# Patient Record
Sex: Male | Born: 1960
Health system: Southern US, Community
[De-identification: ages and names within clinical notes are randomized; demographics above are authoritative.]

## PROBLEM LIST (undated history)

## (undated) DIAGNOSIS — C61 Malignant neoplasm of prostate: Secondary | ICD-10-CM

## (undated) DIAGNOSIS — M545 Low back pain, unspecified: Secondary | ICD-10-CM

## (undated) DIAGNOSIS — I82409 Acute embolism and thrombosis of unspecified deep veins of unspecified lower extremity: Secondary | ICD-10-CM

## (undated) DIAGNOSIS — M7660 Achilles tendinitis, unspecified leg: Secondary | ICD-10-CM

## (undated) DIAGNOSIS — Z8719 Personal history of other diseases of the digestive system: Secondary | ICD-10-CM

## (undated) DIAGNOSIS — E119 Type 2 diabetes mellitus without complications: Secondary | ICD-10-CM

## (undated) DIAGNOSIS — S93419A Sprain of calcaneofibular ligament of unspecified ankle, initial encounter: Secondary | ICD-10-CM

## (undated) DIAGNOSIS — M1990 Unspecified osteoarthritis, unspecified site: Secondary | ICD-10-CM

## (undated) DIAGNOSIS — I10 Essential (primary) hypertension: Secondary | ICD-10-CM

## (undated) HISTORY — PX: PROSTATE BIOPSY: SHX241

## (undated) HISTORY — DX: Sprain of calcaneofibular ligament of unspecified ankle, initial encounter: S93.419A

## (undated) HISTORY — DX: Type 2 diabetes mellitus without complications: E11.9

## (undated) HISTORY — DX: Low back pain, unspecified: M54.50

## (undated) HISTORY — PX: OTHER SURGICAL HISTORY: SHX169

## (undated) HISTORY — DX: Achilles tendinitis, unspecified leg: M76.60

## (undated) HISTORY — DX: Low back pain: M54.5

---

## 2001-07-15 ENCOUNTER — Emergency Department (HOSPITAL_COMMUNITY): Admission: EM | Admit: 2001-07-15 | Discharge: 2001-07-16 | Payer: Self-pay | Admitting: *Deleted

## 2001-10-06 ENCOUNTER — Encounter: Admission: RE | Admit: 2001-10-06 | Discharge: 2002-01-04 | Payer: Self-pay

## 2001-10-13 ENCOUNTER — Ambulatory Visit (HOSPITAL_COMMUNITY): Admission: RE | Admit: 2001-10-13 | Discharge: 2001-10-13 | Payer: Self-pay | Admitting: Surgery

## 2001-10-16 ENCOUNTER — Encounter: Admission: RE | Admit: 2001-10-16 | Discharge: 2001-10-27 | Payer: Self-pay

## 2005-02-02 ENCOUNTER — Emergency Department (HOSPITAL_COMMUNITY): Admission: EM | Admit: 2005-02-02 | Discharge: 2005-02-02 | Payer: Self-pay | Admitting: Family Medicine

## 2007-02-07 ENCOUNTER — Ambulatory Visit: Payer: Self-pay | Admitting: Physical Medicine & Rehabilitation

## 2007-02-07 ENCOUNTER — Encounter
Admission: RE | Admit: 2007-02-07 | Discharge: 2007-05-08 | Payer: Self-pay | Admitting: Physical Medicine & Rehabilitation

## 2007-02-10 ENCOUNTER — Ambulatory Visit (HOSPITAL_COMMUNITY)
Admission: RE | Admit: 2007-02-10 | Discharge: 2007-02-10 | Payer: Self-pay | Admitting: Physical Medicine & Rehabilitation

## 2007-02-12 ENCOUNTER — Encounter
Admission: RE | Admit: 2007-02-12 | Discharge: 2007-03-11 | Payer: Self-pay | Admitting: Physical Medicine & Rehabilitation

## 2007-03-12 ENCOUNTER — Ambulatory Visit (HOSPITAL_COMMUNITY)
Admission: RE | Admit: 2007-03-12 | Discharge: 2007-03-12 | Payer: Self-pay | Admitting: Physical Medicine & Rehabilitation

## 2007-03-17 ENCOUNTER — Ambulatory Visit: Payer: Self-pay | Admitting: Physical Medicine & Rehabilitation

## 2008-01-12 ENCOUNTER — Ambulatory Visit: Payer: Self-pay | Admitting: Family Medicine

## 2008-01-12 DIAGNOSIS — S93419A Sprain of calcaneofibular ligament of unspecified ankle, initial encounter: Secondary | ICD-10-CM | POA: Insufficient documentation

## 2008-01-12 DIAGNOSIS — M7660 Achilles tendinitis, unspecified leg: Secondary | ICD-10-CM

## 2008-07-14 ENCOUNTER — Ambulatory Visit: Payer: Self-pay | Admitting: Interventional Radiology

## 2008-07-14 ENCOUNTER — Emergency Department (HOSPITAL_BASED_OUTPATIENT_CLINIC_OR_DEPARTMENT_OTHER): Admission: EM | Admit: 2008-07-14 | Discharge: 2008-07-14 | Payer: Self-pay | Admitting: Emergency Medicine

## 2009-05-14 ENCOUNTER — Emergency Department (HOSPITAL_COMMUNITY): Admission: EM | Admit: 2009-05-14 | Discharge: 2009-05-14 | Payer: Self-pay | Admitting: Emergency Medicine

## 2009-06-20 ENCOUNTER — Encounter
Admission: RE | Admit: 2009-06-20 | Discharge: 2009-06-20 | Payer: Self-pay | Admitting: Physical Medicine & Rehabilitation

## 2009-06-20 ENCOUNTER — Ambulatory Visit: Payer: Self-pay | Admitting: Physical Medicine & Rehabilitation

## 2009-12-08 ENCOUNTER — Ambulatory Visit (HOSPITAL_COMMUNITY): Admission: RE | Admit: 2009-12-08 | Discharge: 2009-12-08 | Payer: Self-pay | Admitting: Gastroenterology

## 2010-06-11 ENCOUNTER — Encounter: Payer: Self-pay | Admitting: *Deleted

## 2010-07-19 LAB — DIFFERENTIAL
Basophils Absolute: 0 10*3/uL (ref 0.0–0.1)
Eosinophils Relative: 2 % (ref 0–5)
Lymphs Abs: 1.5 10*3/uL (ref 0.7–4.0)
Monocytes Absolute: 0.6 10*3/uL (ref 0.1–1.0)
Monocytes Relative: 8 % (ref 3–12)
Neutrophils Relative %: 71 % (ref 43–77)

## 2010-07-19 LAB — CBC
HCT: 44.8 % (ref 39.0–52.0)
MCHC: 34.5 g/dL (ref 30.0–36.0)
MCV: 89.8 fL (ref 78.0–100.0)
RBC: 4.98 MIL/uL (ref 4.22–5.81)
WBC: 7.6 10*3/uL (ref 4.0–10.5)

## 2010-07-19 LAB — BASIC METABOLIC PANEL
CO2: 26 mEq/L (ref 19–32)
Chloride: 104 mEq/L (ref 96–112)
Creatinine, Ser: 0.7 mg/dL (ref 0.4–1.5)
GFR calc Af Amer: >60 mL/min (ref >60)
Potassium: 4.5 mEq/L (ref 3.5–5.1)

## 2010-07-19 LAB — D-DIMER, QUANTITATIVE: D-Dimer, Quant: 0.39 ug/mL-FEU (ref 0.00–0.48)

## 2010-07-19 LAB — URIC ACID: Uric Acid, Serum: 8.6 mg/dL — ABNORMAL HIGH (ref 4.0–7.8)

## 2010-08-22 NOTE — Assessment & Plan Note (Signed)
This is a followup appointment for right greater than left-sided low  back pain.   The patient was last seen by me on March 18, 2007 at which time we did  a sacroiliac injection on the right side under fluoro guidance.  He went  on average from a 6/10 pain to now a 2/10 pain.  He has one sore area on  his upper buttock area, this started being noticeable about a week after  an injection.  He can now walk 60 minutes at a time.  He has gone  shopping and has been able to cook standing at the stove for a prolonged  period of time without pain.  He has intermittent numbness in the right  leg, but this has also largely resolved after the injection.   EXAMINATION:  GENERAL:  No acute distress.  Mood and affect are  appropriate.  BACK:  His back has only mild tenderness to palpation over the right  gluteus medius area.  His gait is normal.  His spine range of motion is  75% forward flexion and extension.  LOWER EXTREMITIES STRENGTH:  Normal.   IMPRESSION:  1. 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  I      discussed with the patient the duration of the effect is variable      and he now is to continue with his home exercise program from      physical therapy.  2. Lumbar degenerative disk at L4-5 level does not appear to be the      main pain generator at this time.  Would continue stabilization      program, however.  3. Possible lumbar facet arthropathy, will monitor for signs and      symptoms.  I will see him back on a p.r.n. basis.  Given his      improvement, we can re-inject him as needed.      Erick Colace, M.D.  Electronically Signed     AEK/MedQ  D:  04/01/2007 10:30:44  T:  04/01/2007 11:58:14  Job #:  [id number]   cc:   Claris Gower  Occupational and Physical Therapy

## 2010-08-22 NOTE — Group Therapy Note (Signed)
REASON FOR VISIT:  Low back and right lower extremity pain.   HISTORY:  Patient is a 50 year old male who has a history of low back  pain dating back to when he was in his 69s.  He has been seen by Dr. Zachary George in 2003 for what was low back pain, left anterior side and left  hip pain.  He was hauling a lot of wood rebuilding a basement at that  time and really this started after installing some insulation.  He had  tried some Motrin at that time which helped as well.   He was started on Vioxx but then switched to corticosteroids.  He did  not tolerate the corticosteroids, made him feel funny and only took it  for 2 days.  At that time he had a far lateral disk herniation leftward  L4-5 within the neuroforamen and this was concordant with his pain at  that time.  He did go through some physical therapy and this resulted in  some improvement in his overall symptomatology.   More recently over the last 10 months he has noticed decreasing ability  to ambulate due to pain in the back.  He also has some intermittent pain  down the right lateral side and leg.  Walking on an uphill slope tends  to bring this up, bending forward at the hips and standing bent forward  brings this on.  Extension, rest, heat and stretching backwards seems to  make it better.  He has tried ibuprofen about 800 mg on an as-needed  basis and this does seem to help.  In addition he does have some  problems with pain at night from time to time.   His pain is described as sharp, stabbing, tingling, aching in regards to  the right lower extremity.  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   REVIEW OF SYSTEMS:  Positive for numbness and tingling in the right  lower extremity.  He has had about a 50 pound weight gain attributed to  decreased exercise.   SOCIAL HISTORY:  Married, lives with his wife who is an Production designer, theatre/television/film at  the [employer].  No smoking, 2 drinks per  day.   PAST HISTORY:  Hypertension.   MEDICATIONS:  Metoprolol and fosinopril.   OTHER PAST MEDICAL HISTORY:  Lateral epicondylitis.  He has had one  Urgent Care visit in 2006 for what sounds like gout but no ongoing  problems with this.   High blood pressure 141/86, pulse 79, respirations 18, O2 sat 96% room  air, mood and affect are appropriate, orientation x3, the affect is  bright and alert.  Gait is normal.  His lumbar range of motion is good,  he has full forward flexion and extension.  He has no significant  tenderness to palpation in the lumbar paraspinal area.  He has normal  strength in the lower and upper extremities.  His spine does not show  any signs of scoliosis, although he does give a history that he has been  told he has scoliosis in the past.  His lower extremity reflexes are  difficult to elict, basically 1+ when facilitated in the knees and  ankles, but same is true for the upper extremities.   His range of motion is full in the hips, knees and ankles.  He has no  swelling in the feet, again coloration is good, no bruising, no knee or  ankle effusions.  Neck range of motion is normal.   Review of MRI from 2003:  No problems at L1-2, L2-3 has some diffuse  annular bulging, L3-4 has mild annular bulging, mild facet hypertrophy,  L4-5 has disk herniation posterior lateral on the left and L5-S1 he had  mild facet hypertrophy.   IMPRESSION:  Lumbar degenerative disk appears to have been increasingly  symptomatic over the last 10 months.  In addition he has intermittent  right radicular symptoms that fall in L4 that correspond to L3,4,5  dermatomes.   Of note is that his previous radicular discomfort was on the left side.   He may have some facet arthropathy as well, however primary exacerbating  symptoms are forward flexion.   PLAN:  1. Will check 2 views of the lumbar spine, look for disk space      narrowing, look for spondylosis or even outside chance of  a      vertebral compression.  2. Physical therapy looking at extensor exercises using a therapeutic      ball as well as floor exercises, will also do hamstring stretching      and once some core strengthening is achieved, will transition to      more of an aerobic program which is the patient's ultimate goal -      he would like to get back to hiking.  3. In terms of medications, I think the intermittent Motrin is      appropriate, certainly if he has more constant pain, would give him      a course of nonsteroidal anti-inflammatory on a daily basis.  4. Have given him some samples of Amrix that he can take at night for      muscle spasms and to help with sleep related to this.  5. Have given him literature on lumbar disk herniation, acute and      chronic back pain as well as exercise for back pain.  I will see him back in 3 weeks to monitor his therapy program and check  his x-rays.  Should he not respond to treatment would need repeat MRI  and should his radicular discomfort become more constant, consider  epidural steroid injection, transforaminal route.      Erick Colace, M.D.  Electronically Signed     AEK/MedQ  D:  02/10/2007 09:56:19  T:  02/10/2007 15:27:08  Job #:  [id number]

## 2010-08-22 NOTE — Assessment & Plan Note (Signed)
REASON FOR VISIT:  Low back and right lower extremity pain.   A 50 year old male with a history of lower back pain dating back to when  he was in his 55s.  He was seen by Dr. Leanord Asal in 2003 for low back  pain on left anterior side and left hip pain.  He did have a leftward L4-  5 disk protrusion bilateral herniation into the neural foramen  concordant with his pain.  His pain is now, over the last 10 months,  switched to the right side.  He has had x-rays since last visit  demonstrating what appears to be a right unilateral pars defect at L5.  SI joint and sacrum looks intact.  No evidence for spondylolisthesis.  Of note that an MRI of 2003, there was no evidence of the suspected pars  defect on the right.   He started physical therapy, had about 4 visits, starting some all fours  lumbar stabilization, doing plank exercise.  Pain is exacerbated by  hyperextension in that position but relieved with some flexion of the  spine; however, when he is in the standing position, he seems to have  more pain relief with extension and with forward flexion.  He has some  numbness and tingling right lower extremity which was at the knee last  time I saw him on November 3 but has extended to the proximal lateral  calf on the right side.   I reviewed his x-rays with him today.   PAST HISTORY:  Hypertension, and his blood pressure is a bit up today at  156/92.   His pain interferes with activity at a 9/10 level.  The pain is worse  during the daytime hours, and he does okay at night.  Walking and  bending seemed to exacerbate his pain.  He walks 8 minutes at a time on  a treadmill before he has pain.  He can climb steps; he drives; he  retired about 5 years ago from the [employer].  He has numbness.   REVIEW OF SYSTEMS:  Also positive for weight gain due to what he thinks  contributed to decreased physical activity.   Married, lives with his wife and son.   VITAL SIGNS:  156/92, pulse 78,  respiratory rate 18, O2 saturation 96%  on room air.  GENERAL:  In no acute distress.  Alert and oriented x3.  Affect is  bright and alert.  Gait, he is walking with a limp.  BACK:  No tenderness to palpation.  He has good forward flexion to about  75% range after which this becomes painful.  He does not have any pain  with extension.   His pain is more with right-sided bending than left-sided bending.  Deep  tendon reflexes are normal.  Sensation reduced in right lateral calf.  He has normal strength in hip flexion, knee extension, EHL and  dorsiflexors.  Normal sensation in the feet.   IMPRESSION:  Low back pain and radicular symptoms.  Suspect right L4  and/or L5 radiculopathy.  In terms of his axial pain, may have facet  syndrome, and it is difficult to say how the suspected right-sided pars  fracture might be playing into his symptoms.   PLAN:  Given that he has had some physical therapy and anti-  inflammatories and relative rest but continues with symptomatology and  increasing numbness, we will check MRI of the lumbar spine without  contrast. This should help in further evaluating the suspected pars  fracture but bone scan with spect may be needed additionally for  gauging acuity. I will see him back in 2 weeks.  He will continue  physical therapy avoiding excessive flexion until that time.  May  benefit from transforaminal epidural steroid injection versus facet  injection.      Erick Colace, M.D.  Electronically Signed     AEK/MedQ  D:  02/28/2007 16:52:05  T:  03/01/2007 14:03:15  Job #:  [id number]

## 2010-08-22 NOTE — Procedures (Signed)
[name], [name]                ACCOUNT NO.:  [id number]   MEDICAL RECORD NO.:  [id number]          PATIENT TYPE:  REC   LOCATION:  [facility]                          FACILITY:  [facility]   PHYSICIAN:  Erick Colace, M.D.DATE OF BIRTH:  May 11, 1960   DATE OF PROCEDURE:  DATE OF DISCHARGE:                               OPERATIVE REPORT   PROCEDURE:  Right sacroiliac injection under fluoroscopic guidance.   INDICATIONS:  Right sacroiliac pain going back into the lumbar spine as  well as buttock area, radiating to the posterior thigh.  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   The pain is rated at 6/10 on average and with activity in a seated  position is basically a 0.   Informed consent was obtained after describing the risks and benefits of  the procedure to the patient.  These include bleeding, bruising,  infection, loss of bowel or bladder function, temporary or permanent  paralysis.  He elects to proceed and has given written consent.   The patient was placed prone on fluoroscopy table, Betadine prep,  sterile drape. A 25-gauge inch and a half needle was used to anesthetize  skin and subcu tissue.  Then a 25-gauge 3-inch spinal needle was  inserted under fluoroscopic guidance, right SI joint.  AP, lateral  images utilized.  Omnipaque 180 x0.5 mL demonstrated good joint outline,  no intravascular uptake, followed by injection of 0.5 mL of 40 mg/mL  Depo-Medrol and 1 mL of 2% NPF lidocaine.  The patient tolerated the  procedure well.  Given that his pain is activity-related, provoked by  bending forward and squatting, will ask him to do this at home and  report on relative relief.      Erick Colace, M.D.  Electronically Signed     AEK/MEDQ  D:  03/18/2007 13:07:47  T:  03/18/2007 15:36:56  Job:  [id number]

## 2010-08-25 NOTE — Consult Note (Signed)
[name], [name]                            ACCOUNT NO.:  [id number]   MEDICAL RECORD NO.:  [id number]                   PATIENT TYPE:  OUT   LOCATION:  XRAY                                 FACILITY:  [facility]   PHYSICIAN:  Sondra Come, D.O.                 DATE OF BIRTH:  22-Aug-1960   DATE OF CONSULTATION:  DATE OF DISCHARGE:  10/13/2001                  PHYSICAL MEDICINE & REHABILITATION CONSULTATION   [name] returns to clinic today as scheduled for reevaluation.  He was last  seen on 10/15/2001.  He has finished his course of oral steroids which have  completed resolved his low back pain and lower extremity radicular pain  secondary to an L4-5 disc herniation into the left neural foramen with left  L4 nerve root involvement.  The patient has gone through a physical therapy  program and has been discharged to a home exercise program where he  continues with McKenzie extension exercises as well as abdominal  strengthening.  He attempted to walk yesterday and got about five minutes  into his walk and began to feel a tightness in his low back and stopped.  He  is not currently taking any medications.  Overall he feels like his function  has improved and he is satisfied with his symptom resolution.  He also  complains of occasional discomfort in this mid thoracic region which he has  had over the past 10 years, primarily notices this when he wakes up from  sleep.  It does not bother him during the day.  It seems to ease off with  activity.  He denies any further neurologic complaints.  Denies any bowel or  bladder dysfunction.  I reviewed health and history form and 14-point review  of systems.   PHYSICAL EXAMINATION:  A healthy male in no acute distress.  Blood pressure  133/85, pulse 85, respirations 14, O2 saturation 99% on room air.  Manual  muscle testing is 5/5 bilateral lower extremities.  Sensory examination is  intact to light touch bilateral lower extremities.  Reflexes  are 1+/4  bilateral patellar, 0/4 bilateral medial hamstrings and Achilles today.  Straight leg raise is negative bilaterally.  Examination of his spine  reveals tight, ropey right thoracic paraspinal muscles from T5 to T8 with  tenderness to palpation.  There seems to be a slight decreased rotational  component.   IMPRESSION:  1. Lumbar disc herniation at L4-5 with essentially resolved low back pain     and lower extremity radicular pain.  2. Somatic dysfunction of the thoracic spine.   PLAN:  1. Patient to continue home exercise program and I have instructed him on     scapular stabilization exercises and thoracic stabilization.  He may add     this with his current lumbar stabilization program.  2. Continue Vioxx as needed.  3. If patient's thoracic back pain becomes intolerable will consider further     imaging studies to  include CT scan versus SPECT scan and possibly a     diagnostic facet injection.  Consider further manual therapy although he     has had chiropractic manipulation without any significant improvement.  4. Patient to return to clinic as needed.   The patient was educated in the above findings and recommendations and  understands.  There were no barriers in communication.                                               Sondra Come, D.O.    JJW/MEDQ  D:  10/31/2001  T:  11/07/2001  Job:  [id number]

## 2010-08-25 NOTE — Consult Note (Signed)
[facility]  Patient:    SION, REINDERS Visit Number: [id number] MRN: [id number]          Service Type: PMG Location: [facility] Attending Physician:  Sondra Come Dictated by:   Sondra Come, D.O. Proc. Date: 10/06/01 Admit Date:  10/06/2001                            Consultation Report  CHIEF COMPLAINT:  Low back pain.  HISTORY OF PRESENT ILLNESS:  Mr. Tappan is a pleasant 50 year old right hand dominant male who presents to clinic today complaining of low back pain radiating into his left hip and now into the left anterior thigh over the past four days.  His onset of low back pain was approximately two months ago.  The patient denies any particular injury, although he states at that time he was building a basement and had been hauling a lot of wood.  He began noticing his low back pain when he was installing some insulation.  Initially, he self treated with Motrin and rest which he states helped.  He had asked his family physician once he felt better if he should start increasing his activity and he did so.  Apparently, that night he went on a walk and his symptoms returned.  Since that time he has primarily been resting his low back.  His function and quality of life indexes have declined.  His pain is an 8/10 on a subjective scale.  He describes his pain as constant, dull, achy, and throbbing.  His symptoms are worse with walking and bending and improved with rest, ice, and medications which include Motrin over-the-counter.  He has taken 50 mg of his wifes Vioxx which also seemed to help.  He denies any numbness or paraesthesias in his lower extremities.  He denies bowel and bladder dysfunction.  He denies fever, chills, night sweats, weight loss.  He denies any increased low back pain with coughing, sneezing, or bowel movement. He denies any lower extremity weakness.  I review health and history form and 14 point review of systems.  The patient admits  to occasional heartburn.  PAST MEDICAL HISTORY:  Hypertension.  PAST SURGICAL HISTORY:  Denies.  FAMILY HISTORY:  Cancer, hypertension.  SOCIAL HISTORY:  Denies smoking.  Admits to occasional alcohol use.  He is married and is not currently working.  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  ALLERGIES:  SULFA.  MEDICATIONS:  Over-the-counter Motrin.  PHYSICAL EXAMINATION  GENERAL:  Healthy male in no acute distress.  VITAL SIGNS:  Blood pressure 126/77, pulse 95, respirations 12, O2 saturation 96% on room air.  BACK:  Level pelvis without scoliosis.  There is normal lumbar lordosis. Range of motion of the lumbar spine is full in all planes with pain mainly on left rotation plus extension.  There is minimal left-sided lumbar discomfort on flexion.  NEUROLOGIC:  Manual muscle testing is 5/5 bilateral lower extremities. Sensory examination is intact to light touch bilateral lower extremities. Straight leg raise is negative bilaterally.  Femoral stretch test is negative bilaterally.  Muscle stretch reflexes are 2+/4 bilateral patellar, 1+/4 bilateral medial hamstrings, and 0/4 bilateral Achilles.  Toes are downgoing. No ankle clonus noted.  EXTREMITIES:  No heat, erythema, or edema in the lower extremities.  IMPRESSION:  Low back pain, etiology uncertain.  Physical examination suggests possible posterior element pain such as coming from the lumbar facet joints. However, cannot rule  out lumbar disk herniation, although physical examination is not entirely consistent with this.  PLAN: 1. I had a long discussion with Mr. Borowski regarding treatment options.  At this    point it is reasonable to obtain an MRI of the lumbar spine to rule out    herniated disk and facet arthropathy. 2. Will begin Vioxx 25 mg two p.o. q.d. x5 days, then one p.o. q.d. #35 with    one refill. 3. Will begin physical therapy for range of motion, stretching, lumbar    stabilization exercises, and  modalities as needed leading to a home    exercise program three times per week for four weeks. 4. Consider interventional procedures as predicated upon patients response to    the above treatment as well as indicated by imaging studies. 5. The patient is to return to clinic following his MRI.  The patient was educated on the above findings and recommendations and understands.  No barriers to communication. Dictated by:   Sondra Come, D.O. Attending Physician:  Sondra Come DD:  10/06/01 TD:  10/08/01 Job: [id number]

## 2010-08-25 NOTE — Consult Note (Signed)
[facility]  Patient:    [name], [name] Visit Number: [id number] MRN: [id number]          Service Type: PMG Location: [facility] Attending Physician:  Sondra Come Dictated by:   Sondra Come, D.O. Proc. Date: 10/15/01 Admit Date:  10/06/2001                            Consultation Report  REASON FOR CONSULTATION:  The patient returns to clinic today for re-evaluation.  He was initially seen on 10/06/01.  He continues to have low back pain radiating into his left buttock and hip which has improved significantly according to his report.  However, he notes his pain does fluctuate with activity, and the more active he is the more intense the pain is in his left buttock and hip.  In the interim, he had a MRI of his lumbar spine which reveals a far lateral disk herniation, leftward at L4-5, within the neuroforamen.  This does correspond to his L4 distribution radicular pain. Currently, his pain is a 2/10 on a subjective scale while sitting.  However, he states that it gets up to 8 to 10/10 on a subjective scale with activity. Overall, his function and quality of life has improved.  He thinks the Vioxx has helped him significantly.  Despite this, his pain does increase with activity, mainly walking and bending.  I review the health and history form and 14 point review of systems, and had an extensive consultation with the patient regarding treatment options.  PHYSICAL EXAMINATION:  GENERAL:  A healthy male in no acute distress.  VITAL SIGNS:  Blood pressure is 154/85, pulse 79, respiratory rate 16, O2 saturation 98% on room air.  EXTREMITIES:  Manual muscle testing is 5/5 bilateral lower extremities. Sensory examination intact to light touch bilateral lower extremities.  Muscle stretch reflexes are 2+/4 bilateral patellar, 1+/4 bilateral medial hamstrings, and 0/4 bilateral Achilles.  Straight leg raise is negative bilaterally.  Femoral stretch test is  negative on the left.  Minimal tenderness to palpation of the lumbar paraspinals.  IMPRESSION:  Lumbar disk herniation at L4-5 into the left neural foramen with left L4 distribution radicular pain.  PLAN: 1. I had a long discussion with the patient regarding treatment options.  This    was a long consultation of greater than 25 minutes duration.  Initially, I    would like to have him begin his physical therapy for McKenzie extension    exercises. 2. We will start him on a prednisone 10 mg taper, 60 mg x2 days, 50 mg x2    days, 40 mg x2 days, 30 mg x2 days, 20 mg x2 days, 10 mg x2 days, then    finish.    We will have him hold the Vioxx while he is on the prednisone. 3. The patient may use Tylenol for analgesia while on the prednisone.  He does    have some remaining Tylenol No. 3 which I told him it is appropriate to use    for severe pain. 4. If the patient is not getting any considerable relief over the next few    weeks with the steroid taper and the physical therapy, we will consider    interventional procedure such as a lumbar epidural steroid injection,    either by interlaminar or transforaminal approach.  We will discuss this    with the patient in two weeks at followup.  The patient was educated on the above findings and recommendations and understands.  There were no barriers to communication. Dictated by:   Sondra Come, D.O. Attending Physician:  Sondra Come DD:  10/15/01 TD:  10/19/01 Job: [id number]

## 2013-03-10 ENCOUNTER — Other Ambulatory Visit: Payer: Self-pay | Admitting: Gastroenterology

## 2013-04-15 ENCOUNTER — Telehealth: Payer: Self-pay | Admitting: Physical Medicine & Rehabilitation

## 2013-04-15 NOTE — Telephone Encounter (Signed)
Pt called wanting to come back to see Dr. Letta Pate... 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 Pt was advise that a note will be sent to the clinical staff and then a decision will be made.. Pt last seen in 2011... Pt's home number is [phone] and cell number [phone]

## 2013-04-16 ENCOUNTER — Encounter: Payer: 59 | Attending: Physical Medicine & Rehabilitation

## 2013-04-16 ENCOUNTER — Encounter: Payer: Self-pay | Admitting: Physical Medicine & Rehabilitation

## 2013-04-16 ENCOUNTER — Encounter (INDEPENDENT_AMBULATORY_CARE_PROVIDER_SITE_OTHER): Payer: Self-pay

## 2013-04-16 ENCOUNTER — Ambulatory Visit (HOSPITAL_BASED_OUTPATIENT_CLINIC_OR_DEPARTMENT_OTHER): Payer: 59 | Admitting: Physical Medicine & Rehabilitation

## 2013-04-16 VITALS — BP 155/100 | HR 87 | Resp 14 | Ht 71.0 in | Wt 300.6 lb

## 2013-04-16 DIAGNOSIS — M51379 Other intervertebral disc degeneration, lumbosacral region without mention of lumbar back pain or lower extremity pain: Secondary | ICD-10-CM | POA: Insufficient documentation

## 2013-04-16 DIAGNOSIS — G8929 Other chronic pain: Secondary | ICD-10-CM

## 2013-04-16 DIAGNOSIS — M47817 Spondylosis without myelopathy or radiculopathy, lumbosacral region: Secondary | ICD-10-CM | POA: Insufficient documentation

## 2013-04-16 DIAGNOSIS — R52 Pain, unspecified: Secondary | ICD-10-CM

## 2013-04-16 DIAGNOSIS — M545 Low back pain, unspecified: Secondary | ICD-10-CM | POA: Insufficient documentation

## 2013-04-16 DIAGNOSIS — M5137 Other intervertebral disc degeneration, lumbosacral region: Secondary | ICD-10-CM | POA: Insufficient documentation

## 2013-04-16 MED ORDER — KETOROLAC TROMETHAMINE 10 MG PO TABS: 10.0000 mg | ORAL_TABLET | Freq: Four times a day (QID) | ORAL | Status: DC | PRN

## 2013-04-16 MED ORDER — CYCLOBENZAPRINE HCL 10 MG PO TABS: 10.0000 mg | ORAL_TABLET | Freq: Three times a day (TID) | ORAL | Status: DC | PRN

## 2013-04-16 NOTE — Patient Instructions (Signed)
Please say active alternate positions. Do not sit for a prolonged period time do not stand for prolonged  Time.  Toradol injection today Start toradol pills tomorrow May try muscle relaxant as well Ketorolac tablets What is this medicine? KETOROLAC (kee toe ROLE ak) is a non-steroidal anti-inflammatory drug (NSAID). It is used for a short while to treat moderate to severe pain, including pain after surgery. It should not be used for more than 5 days. This medicine may be used for other purposes; ask your health care provider or pharmacist if you have questions. COMMON BRAND NAME(S): Toradol What should I tell my health care provider before I take this medicine? They need to know if you have any of these conditions: -asthma -bleeding problems like hemophilia -cigarette smoker -drink more than 3 alcohol containing drinks a day -heart disease or circulation problems such as heart failure or leg edema (fluid retention) -high blood pressure -kidney disease -liver disease -stomach bleeding or ulcers -an unusual or allergic reaction to ketorolac, aspirin, other NSAIDs, other medicines, foods, dyes, or preservatives -pregnant or trying to get pregnant -breast-feeding How should I use this medicine? Take this medicine by mouth with a full glass of water. Follow the directions on the prescription label. Take your medicine at regular intervals. Do not take your medicine more often than directed. Do not take more than the recommended dose. A special MedGuide will be given to you by the pharmacist with each prescription and refill. Be sure to read this information carefully each time. Talk to your pediatrician regarding the use of this medicine in children. While this drug may be prescribed for children as young as 39 years of age for selected conditions, precautions do apply. Patients over 78 years old may have a stronger reaction and need a smaller dose. Overdosage: If you think you have taken too  much of this medicine contact a poison control center or emergency room at once. NOTE: This medicine is only for you. Do not share this medicine with others. What if I miss a dose? If you miss a dose, take it as soon as you can. If it is almost time for your next dose, take only that dose. Do not take double or extra doses. What may interact with this medicine? Do not take this medicine with any of the following medications: -aspirin and aspirin-like medicines -cidofovir -methotrexate -NSAIDs, medicines for pain and inflammation, like ibuprofen or naproxen -pemetrexed -probenecid This medicine may also interact with the following medications: -alcohol -alendronate -alprazolam -carbamazepine -cyclosporine -diuretics -flavocoxid -fluoxetine -ginkgo -lithium -medicines for high blood pressure like enalapril -medicines that affect platelets like pentoxifylline -medicines that treat or prevent blood clots like heparin, warfarin -muscle relaxants -phenytoin -steroid medicines like prednisone or cortisone -thiothixene This list may not describe all possible interactions. Give your health care provider a list of all the medicines, herbs, non-prescription drugs, or dietary supplements you use. Also tell them if you smoke, drink alcohol, or use illegal drugs. Some items may interact with your medicine. What should I watch for while using this medicine? Tell your doctor or health care professional if your pain does not get better. Talk to your doctor before taking another medicine for pain. Do not treat yourself. This medicine does not prevent heart attack or stroke. In fact, this medicine may increase the chance of a heart attack or stroke. The chance may increase with longer use of this medicine and in people who have heart disease. If you take aspirin to prevent  heart attack or stroke, talk with your doctor or health care professional. Do not take medicines such as ibuprofen and naproxen  with this medicine. Side effects such as stomach upset, nausea, or ulcers may be more likely to occur. Many medicines available without a prescription should not be taken with this medicine. This medicine can cause ulcers and bleeding in the stomach and intestines at any time during treatment. Do not smoke cigarettes or drink alcohol. These increase irritation to your stomach and can make it more susceptible to damage from this medicine. Ulcers and bleeding can happen without warning symptoms and can cause death. You may get drowsy or dizzy. Do not drive, use machinery, or do anything that needs mental alertness until you know how this medicine affects you. Do not stand or sit up quickly, especially if you are an older patient. This reduces the risk of dizzy or fainting spells. This medicine can cause you to bleed more easily. Try to avoid damage to your teeth and gums when you brush or floss your teeth. What side effects may I notice from receiving this medicine? Side effects that you should report to your doctor or health care professional as soon as possible: -allergic reactions like skin rash, itching or hives, swelling of the face, lips, or tongue -black or tarry stools -breathing problems -changes in vision -chest pain -high blood pressure -nausea or vomiting -redness, blistering, peeling or loosening of the skin, including inside the mouth -severe abdominal pain -slurred speech or weakness on one side of the body -unexplained weight gain or swelling -unusual bleeding or bruising -unusually weak or tired -yellowing of eyes or skin Side effects that usually do not require medical attention (report to your doctor or health care professional if they continue or are bothersome): -diarrhea -dizziness -headache -heartburn This list may not describe all possible side effects. Call your doctor for medical advice about side effects. You may report side effects to FDA at 1-800-FDA-1088. Where  should I keep my medicine? Keep out of the reach of children. Store at room temperature between 20 and 25 degrees C (68 and 77 degrees F). Throw away any unused medicine after the expiration date. NOTE: This sheet is a summary. It may not cover all possible information. If you have questions about this medicine, talk to your doctor, pharmacist, or health care provider.  2014, Elsevier/Gold Standard. (2007-08-14 17:23:47)

## 2013-04-16 NOTE — Progress Notes (Signed)
Toradol 60 mg Injection ordered by Dr Letta Pate.  Injection given in left upper outer quadrant of gluteal muscle.  Collier Bullock RN

## 2013-04-16 NOTE — Telephone Encounter (Signed)
Patient is being seen 04/16/2012 at 11:45am by Dr Letta Pate.

## 2013-04-16 NOTE — Progress Notes (Signed)
Subjective:    Patient ID: [name], male    DOB: December 25, 1960, 53 y.o.   MRN: [id number]   A 53 year old male with a long history of low back pain on and off since   he was in his 68s.  Had been seem by podiatrist, Dr. Arlina Robes, in 2003   for low back pain, left anterior side, left hip pain.  At that time, he   was hauling a lot of wood.  He has had a more recent episode in 2008,   where he had some right-sided back pain, intermittent pain down the   right lateral side and leg, numbness and tingling in his low back.  He   had a prior MRI, showing L4-5 disk herniation mainly on the left side,   mild facet hypertrophy at L5-S1.  He underwent an MRI of the lumbar   spine, March 12, 2007, showing the left eccentric disk bulge without   significant stenosis at L5-S1, some borderline bilateral subarticular   lateral recess stenosis at L4-5 as well as a bulge at that level, mild   bilateral foraminal stenosis.  Because of no significant compressive   lesions, he underwent a right sacroiliac injection and in fact, this   improved his symptoms.  HPI  Woke up with stabbing pain and mid back lower back possibly 1 month ago and has been worsening with time. No radiating pain down the legs. No bowel or bladder issues. Patient had driven about 6 hours a day before. Also states he had a long drive of 14 hours prior to that  Was very active in October and November Used aleve prior to work.  Could not work since that time. Sleeps ok but raises HOB and legs  Had good pain relief with a right sacroiliac injection under fluoroscopic guidance in 2008 Current pain feels different does not involve hip or thigh Pain Inventory Average Pain 9 Pain Right Now 8 My pain is sharp, stabbing and aching  In the last 24 hours, has pain interfered with the following? General activity 10 Relation with others 10 Enjoyment of life 10 What TIME of day is your pain at its worst? daytime Sleep (in general)  Fair  Pain is worse with: bending and standing Pain improves with: rest and heat/ice Relief from Meds: no pain meds  Mobility walk without assistance how many minutes can you walk? 10 ability to climb steps?  yes do you drive?  yes  Function retired  Neuro/Psych No problems in this area  Prior Studies Any changes since last visit?  yes  Physicians involved in your care Any changes since last visit?  no   No family history on file. History   Social History  . Marital Status: Married    Spouse Name: N/A    Number of Children: N/A  . Years of Education: N/A   Social History Main Topics  . Smoking status: None  . Smokeless tobacco: None  . Alcohol Use: None  . Drug Use: None  . Sexual Activity: None   Other Topics Concern  . None   Social History Narrative  . None   No past surgical history on file. Past Medical History  Diagnosis Date  . Lower back pain   . Achilles tendonitis   . Sprain and strain of calcaneofibular ligament    BP 155/100  Pulse 87  Resp 14  Ht 5\' 11"  (1.803 m)  Wt 300 lb 9.6 oz (136.351 kg)  BMI  41.94 kg/m2  SpO2 95%      Review of Systems  Constitutional: Positive for unexpected weight change.  Musculoskeletal: Positive for back pain.  All other systems reviewed and are negative.       Objective:   Physical Exam  Constitutional: He is oriented to person, place, and time.  Musculoskeletal:  Pain to palpation and lumbar paraspinals Pain with forward flexion and extension and right lateral bending Less than 25% range of motion  Pain with bilateral hip flexion Ltd. internal rotation bilateral hips normal external rotation  Negative straight leg raising test  Neurological: He is alert and oriented to person, place, and time. He has normal strength. No sensory deficit. Gait abnormal.  Pain in addition with hip flexion bilateral  Gait is antalgic move slowly no foot drop  Difficult to elicit reflexes in bilateral  lower extremities          Assessment & Plan:  1. Acute on chronic low back pain has a history of lumbar degenerative disc as well as lumbar spondylosis no evidence of radiculopathy. Also does not appear to be sacroiliac pain recurrence.  Given multidirectional pain exacerbation with movement suspect a combination of lumbar spondylosis as well as degenerative disc. Onset related to prolonged driving. Recommend frequent changes of position. 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 No prolonged sitting or prolonged standing. Discussed anti-inflammatories. Did not feel well on Medrol Dosepak in the past. Therefore will give Toradol injection 60 mg IM today and start Toradol oral 10 mg 4 times a day when necessary for the next 5 days  I'll see the patient back in 4 days when he will bring in his mother-in-law who is also a patient of mine.

## 2013-04-20 ENCOUNTER — Encounter: Payer: 59 | Attending: Physical Medicine & Rehabilitation | Admitting: *Deleted

## 2013-04-20 DIAGNOSIS — M5431 Sciatica, right side: Secondary | ICD-10-CM

## 2013-04-20 DIAGNOSIS — M5430 Sciatica, unspecified side: Secondary | ICD-10-CM | POA: Insufficient documentation

## 2013-04-20 DIAGNOSIS — M545 Low back pain, unspecified: Secondary | ICD-10-CM | POA: Insufficient documentation

## 2013-04-20 MED ORDER — KETOROLAC TROMETHAMINE 60 MG/2ML IM SOLN
60.0000 mg | Freq: Once | INTRAMUSCULAR | Status: AC
  Administered 2013-04-20: 60 mg via INTRAMUSCULAR

## 2013-04-20 NOTE — Progress Notes (Signed)
Here with his mother-in-law to see Dr Letta Pate.  He received an injection of toradol 60 mg IM last week and it helped but did not last.  Dr Letta Pate ordered a second injection to be given today.  I instructed him on the risk of GI upset with NSAIDs and to be aware if he should develop any stomach pain.  He has not had any thus far.  Injection of 60 mg IM toradol given in right upper outer quadrant of his gluteaus maximus muscle.  No bleeding after injection.  Bandaid applied.

## 2013-04-27 ENCOUNTER — Ambulatory Visit
Admission: RE | Admit: 2013-04-27 | Discharge: 2013-04-27 | Disposition: A | Discharge status: Home / Self Care | Payer: 59 | Source: Ambulatory Visit | Attending: Physical Medicine & Rehabilitation | Admitting: Physical Medicine & Rehabilitation

## 2013-04-27 DIAGNOSIS — M545 Low back pain, unspecified: Secondary | ICD-10-CM

## 2013-04-27 DIAGNOSIS — M5431 Sciatica, right side: Secondary | ICD-10-CM

## 2013-04-30 ENCOUNTER — Ambulatory Visit: Payer: 59 | Attending: Physical Medicine & Rehabilitation | Admitting: Physical Therapy

## 2013-04-30 DIAGNOSIS — M545 Low back pain, unspecified: Secondary | ICD-10-CM | POA: Insufficient documentation

## 2013-04-30 DIAGNOSIS — M25559 Pain in unspecified hip: Secondary | ICD-10-CM | POA: Insufficient documentation

## 2013-04-30 DIAGNOSIS — R5381 Other malaise: Secondary | ICD-10-CM | POA: Insufficient documentation

## 2013-04-30 DIAGNOSIS — IMO0001 Reserved for inherently not codable concepts without codable children: Secondary | ICD-10-CM | POA: Insufficient documentation

## 2013-05-11 ENCOUNTER — Ambulatory Visit: Payer: 59 | Attending: Physical Medicine & Rehabilitation | Admitting: Physical Therapy

## 2013-05-11 DIAGNOSIS — M25559 Pain in unspecified hip: Secondary | ICD-10-CM | POA: Insufficient documentation

## 2013-05-11 DIAGNOSIS — IMO0001 Reserved for inherently not codable concepts without codable children: Secondary | ICD-10-CM | POA: Insufficient documentation

## 2013-05-11 DIAGNOSIS — M545 Low back pain, unspecified: Secondary | ICD-10-CM | POA: Insufficient documentation

## 2013-05-11 DIAGNOSIS — R5381 Other malaise: Secondary | ICD-10-CM | POA: Insufficient documentation

## 2013-05-14 ENCOUNTER — Encounter: Payer: 59 | Admitting: Rehabilitation

## 2013-05-18 ENCOUNTER — Encounter: Payer: 59 | Admitting: Rehabilitation

## 2013-05-20 ENCOUNTER — Encounter: Payer: 59 | Admitting: Rehabilitation

## 2013-06-17 ENCOUNTER — Ambulatory Visit
Admission: RE | Admit: 2013-06-17 | Discharge: 2013-06-17 | Disposition: A | Discharge status: Home / Self Care | Payer: 59 | Source: Ambulatory Visit | Attending: Internal Medicine | Admitting: Internal Medicine

## 2013-06-17 ENCOUNTER — Other Ambulatory Visit: Payer: Self-pay | Admitting: Internal Medicine

## 2013-06-17 DIAGNOSIS — I10 Essential (primary) hypertension: Secondary | ICD-10-CM

## 2013-07-14 ENCOUNTER — Encounter (HOSPITAL_COMMUNITY): Payer: Self-pay | Admitting: *Deleted

## 2013-07-14 ENCOUNTER — Ambulatory Visit (HOSPITAL_COMMUNITY)
Admission: RE | Admit: 2013-07-14 | Discharge: 2013-07-14 | Disposition: A | Discharge status: Home / Self Care | Payer: 59 | Source: Ambulatory Visit | Attending: Gastroenterology | Admitting: Gastroenterology

## 2013-07-14 ENCOUNTER — Encounter (HOSPITAL_COMMUNITY)
Admission: RE | Disposition: A | Discharge status: Home / Self Care | Payer: Self-pay | Source: Ambulatory Visit | Attending: Gastroenterology

## 2013-07-14 ENCOUNTER — Encounter (INDEPENDENT_AMBULATORY_CARE_PROVIDER_SITE_OTHER): Payer: Self-pay

## 2013-07-14 DIAGNOSIS — G8929 Other chronic pain: Secondary | ICD-10-CM | POA: Insufficient documentation

## 2013-07-14 DIAGNOSIS — M109 Gout, unspecified: Secondary | ICD-10-CM | POA: Insufficient documentation

## 2013-07-14 DIAGNOSIS — D126 Benign neoplasm of colon, unspecified: Secondary | ICD-10-CM | POA: Insufficient documentation

## 2013-07-14 DIAGNOSIS — I10 Essential (primary) hypertension: Secondary | ICD-10-CM | POA: Insufficient documentation

## 2013-07-14 DIAGNOSIS — M545 Low back pain, unspecified: Secondary | ICD-10-CM | POA: Insufficient documentation

## 2013-07-14 DIAGNOSIS — K620 Anal polyp: Secondary | ICD-10-CM | POA: Insufficient documentation

## 2013-07-14 DIAGNOSIS — K5730 Diverticulosis of large intestine without perforation or abscess without bleeding: Secondary | ICD-10-CM | POA: Insufficient documentation

## 2013-07-14 DIAGNOSIS — K621 Rectal polyp: Principal | ICD-10-CM

## 2013-07-14 HISTORY — DX: Essential (primary) hypertension: I10

## 2013-07-14 HISTORY — DX: Unspecified osteoarthritis, unspecified site: M19.90

## 2013-07-14 HISTORY — PX: COLONOSCOPY: SHX5424

## 2013-07-14 HISTORY — DX: Personal history of other diseases of the digestive system: Z87.19

## 2013-07-14 SURGERY — COLONOSCOPY
Anesthesia: Moderate Sedation

## 2013-07-14 MED ORDER — FENTANYL CITRATE 0.05 MG/ML IJ SOLN
INTRAMUSCULAR | Status: AC
  Filled 2013-07-14: qty 4

## 2013-07-14 MED ORDER — SODIUM CHLORIDE 0.9 % IV SOLN
INTRAVENOUS | Status: DC
  Administered 2013-07-14: 500 mL via INTRAVENOUS

## 2013-07-14 MED ORDER — MIDAZOLAM HCL 10 MG/2ML IJ SOLN
INTRAMUSCULAR | Status: AC
  Filled 2013-07-14: qty 4

## 2013-07-14 MED ORDER — FENTANYL CITRATE 0.05 MG/ML IJ SOLN
INTRAMUSCULAR | Status: DC | PRN
  Administered 2013-07-14: 50 ug via INTRAVENOUS
  Administered 2013-07-14 (×2): 25 ug via INTRAVENOUS

## 2013-07-14 MED ORDER — DIPHENHYDRAMINE HCL 50 MG/ML IJ SOLN
INTRAMUSCULAR | Status: AC
  Filled 2013-07-14: qty 1

## 2013-07-14 MED ORDER — MIDAZOLAM HCL 5 MG/5ML IJ SOLN
INTRAMUSCULAR | Status: DC | PRN
  Administered 2013-07-14: 2.5 mg via INTRAVENOUS
  Administered 2013-07-14: 2 mg via INTRAVENOUS
  Administered 2013-07-14: 3 mg via INTRAVENOUS
  Administered 2013-07-14: 2.5 mg via INTRAVENOUS

## 2013-07-14 NOTE — H&P (Signed)
  Procedure: Surveillance colonoscopy. 12/08/2009 colonoscopy with removal of a rectal tubulovillous adenomatous polyp  History: The patient is a 53 year old male born May 25, 1960. He underwent a screening colonoscopy on 12/08/2009 with removal of a rectal tubulovillous adenomatous polyp. He is scheduled to undergo a surveillance colonoscopy today.  Past medical history: Hypertension. Gout. Prostatitis. 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 Chronic low back pain.  Allergies: Sulfa drugs  Exam: The patient is alert and lying comfortably on the endoscopy stretcher. Abdomen is soft and nontender to palpation. Lungs are clear to auscultation. Cardiac exam reveals a regular rhythm.  Plan: Proceed with surveillance colonoscopy

## 2013-07-14 NOTE — Discharge Instructions (Signed)
Colonoscopy, Care After °Refer to this sheet in the next few weeks. These instructions provide you with information on caring for yourself after your procedure. Your health care provider may also give you more specific instructions. Your treatment has been planned according to current medical practices, but problems sometimes occur. Call your health care provider if you have any problems or questions after your procedure. °WHAT TO EXPECT AFTER THE PROCEDURE  °After your procedure, it is typical to have the following: °· A small amount of blood in your stool. °· Moderate amounts of gas and mild abdominal cramping or bloating. °HOME CARE INSTRUCTIONS °· Do not drive, operate machinery, or sign important documents for 24 hours. °· You may shower and resume your regular physical activities, but move at a slower pace for the first 24 hours. °· Take frequent rest periods for the first 24 hours. °· Walk around or put a warm pack on your abdomen to help reduce abdominal cramping and bloating. °· Drink enough fluids to keep your urine clear or pale yellow. °· You may resume your normal diet as instructed by your health care provider. Avoid heavy or fried foods that are hard to digest. °· Avoid drinking alcohol for 24 hours or as instructed by your health care provider. °· Only take over-the-counter or prescription medicines as directed by your health care provider. °· If a tissue sample (biopsy) was taken during your procedure: °· Do not take aspirin or blood thinners for 7 days, or as instructed by your health care provider. °· Do not drink alcohol for 7 days, or as instructed by your health care provider. °· Eat soft foods for the first 24 hours. °SEEK MEDICAL CARE IF: °You have persistent spotting of blood in your stool 2 3 days after the procedure. °SEEK IMMEDIATE MEDICAL CARE IF: °· You have more than a small spotting of blood in your stool. °· You pass large blood clots in your stool. °· Your abdomen is swollen  (distended). °· You have nausea or vomiting. °· You have a fever. °· You have increasing abdominal pain that is not relieved with medicine. °Document Released: 11/08/2003 Document Revised: 01/14/2013 Document Reviewed: 12/01/2012 °ExitCare® Patient Information ©2014 [employer]. ° °

## 2013-07-14 NOTE — Op Note (Signed)
Procedure: Surveillance colonoscopy. 12/08/2009 colonoscopy with removal of a rectal tubulovillous adenomatous polyp.  Endoscopist: Earle Gell  Premedication: Versed 10 mg. Fentanyl 100 mcg.  Procedure: The patient was placed in the left lateral decubitus position. Anal inspection and digital rectal exam were normal. The Pentax pediatric colonoscope was introduced into the rectum and easily advanced to the cecum. A normal-appearing appendiceal orifice and ileocecal valve were identified. Colonic preparation for the exam today was good.  Rectum. A 5 mm sessile polyp was removed from the distal rectum with the cold snare and submitted for pathological interpretation. Retroflexed view of the distal rectum was normal  Sigmoid colon and descending colon. From the distal sigmoid colon a 3 mm sessile polyp was removed with the cold snare and submitted for pathological interpretation.  Splenic flexure. Normal  Transverse colon. Normal  Hepatic flexure. Normal  Ascending colon. From the distal ascending colon, a 5 mm sessile polyp was removed with the hot snare and submitted for pathologic interpretation.  Cecum and ileocecal valve. Normal  Assessment:  #1. Universal colonic diverticulosis  #2. From the rectum, a 5 mm sessile polyp was removed  #3. From the sigmoid colon, a 3 mm sessile polyp was removed  #4. From the ascending colon, a 5 mm sessile polyp was removed  Recommendation: Schedule surveillance colonoscopy in 5 years

## 2013-07-15 ENCOUNTER — Encounter (HOSPITAL_COMMUNITY): Payer: Self-pay | Admitting: Gastroenterology

## 2014-04-09 DIAGNOSIS — Z86718 Personal history of other venous thrombosis and embolism: Secondary | ICD-10-CM | POA: Insufficient documentation

## 2014-10-29 ENCOUNTER — Other Ambulatory Visit: Payer: Self-pay | Admitting: Internal Medicine

## 2014-10-29 ENCOUNTER — Encounter: Payer: Self-pay | Admitting: Internal Medicine

## 2014-10-29 ENCOUNTER — Telehealth: Payer: Self-pay

## 2014-10-29 ENCOUNTER — Ambulatory Visit (INDEPENDENT_AMBULATORY_CARE_PROVIDER_SITE_OTHER): Payer: PRIVATE HEALTH INSURANCE | Admitting: Internal Medicine

## 2014-10-29 ENCOUNTER — Ambulatory Visit (HOSPITAL_COMMUNITY): Payer: PRIVATE HEALTH INSURANCE | Attending: Internal Medicine

## 2014-10-29 ENCOUNTER — Other Ambulatory Visit (INDEPENDENT_AMBULATORY_CARE_PROVIDER_SITE_OTHER): Payer: PRIVATE HEALTH INSURANCE

## 2014-10-29 VITALS — BP 118/80 | HR 70 | Temp 98.1°F | Ht 71.0 in | Wt 240.4 lb

## 2014-10-29 DIAGNOSIS — H00019 Hordeolum externum unspecified eye, unspecified eyelid: Secondary | ICD-10-CM

## 2014-10-29 DIAGNOSIS — I82401 Acute embolism and thrombosis of unspecified deep veins of right lower extremity: Secondary | ICD-10-CM | POA: Diagnosis not present

## 2014-10-29 DIAGNOSIS — M79604 Pain in right leg: Secondary | ICD-10-CM

## 2014-10-29 DIAGNOSIS — I82511 Chronic embolism and thrombosis of right femoral vein: Secondary | ICD-10-CM | POA: Insufficient documentation

## 2014-10-29 DIAGNOSIS — H5712 Ocular pain, left eye: Secondary | ICD-10-CM

## 2014-10-29 DIAGNOSIS — M1000 Idiopathic gout, unspecified site: Secondary | ICD-10-CM

## 2014-10-29 LAB — CBC WITH DIFFERENTIAL/PLATELET
Basophils Absolute: 0 10*3/uL (ref 0.0–0.1)
Basophils Relative: 0.2 % (ref 0.0–3.0)
EOS PCT: 2.2 % (ref 0.0–5.0)
Eosinophils Absolute: 0.2 10*3/uL (ref 0.0–0.7)
HCT: 48.4 % (ref 39.0–52.0)
HEMOGLOBIN: 16.4 g/dL (ref 13.0–17.0)
LYMPHS ABS: 2 10*3/uL (ref 0.7–4.0)
Lymphocytes Relative: 21 % (ref 12.0–46.0)
MCHC: 34 g/dL (ref 30.0–36.0)
MCV: 88.4 fl (ref 78.0–100.0)
MONO ABS: 0.6 10*3/uL (ref 0.1–1.0)
Monocytes Relative: 6.6 % (ref 3.0–12.0)
NEUTROS PCT: 70 % (ref 43.0–77.0)
Neutro Abs: 6.8 10*3/uL (ref 1.4–7.7)
Platelets: 327 10*3/uL (ref 150.0–400.0)
RBC: 5.48 Mil/uL (ref 4.22–5.81)
RDW: 15.2 % (ref 11.5–15.5)
WBC: 9.7 10*3/uL (ref 4.0–10.5)

## 2014-10-29 LAB — BASIC METABOLIC PANEL
BUN: 26 mg/dL — ABNORMAL HIGH (ref 6–23)
CHLORIDE: 102 meq/L (ref 96–112)
CO2: 29 mEq/L (ref 19–32)
Calcium: 9.9 mg/dL (ref 8.4–10.5)
Creatinine, Ser: 0.79 mg/dL (ref 0.40–1.50)
GFR: 107.9 mL/min (ref >60.00)
Glucose, Bld: 100 mg/dL — ABNORMAL HIGH (ref 70–99)
Potassium: 4.4 mEq/L (ref 3.5–5.1)
SODIUM: 137 meq/L (ref 135–145)

## 2014-10-29 LAB — PROTIME-INR
INR: 1.1 ratio — ABNORMAL HIGH (ref 0.8–1.0)
PROTHROMBIN TIME: 12.1 s (ref 9.6–13.1)

## 2014-10-29 LAB — D-DIMER, QUANTITATIVE (NOT AT ARMC): D-Dimer, Quant: 1.99 ug/mL-FEU — ABNORMAL HIGH (ref 0.00–0.48)

## 2014-10-29 MED ORDER — RIVAROXABAN 15 MG PO TABS: 15.0000 mg | ORAL_TABLET | Freq: Two times a day (BID) | ORAL | Status: DC

## 2014-10-29 MED ORDER — CEPHALEXIN 500 MG PO CAPS: 500.0000 mg | ORAL_CAPSULE | Freq: Four times a day (QID) | ORAL | Status: DC

## 2014-10-29 MED ORDER — RIVAROXABAN 20 MG PO TABS: 20.0000 mg | ORAL_TABLET | Freq: Every day | ORAL | Status: DC

## 2014-10-29 NOTE — Telephone Encounter (Signed)
Critical lab D-dimer 1.99 called to me by [email or web address]---please advise, thanks

## 2014-10-29 NOTE — Progress Notes (Signed)
Subjective:  Patient ID: [name], male    DOB: July 25, 1960  Age: 54 y.o. MRN: [id number]  CC: No chief complaint on file.   HPI   New pt C/o RLE swelling - pt came back from [place] driving all day 1 week ago - much better now, however it is still swollen. The pt recalls the same problem happened 1 year ago. C/o styes - recurrent lately H/o colon polyps  Outpatient Prescriptions Prior to Visit  Medication Sig Dispense Refill  . fosinopril (MONOPRIL) 10 MG tablet Take 10 mg by mouth daily.      . metoprolol succinate (TOPROL-XL) 25 MG 24 hr tablet      . Omega-3 Fatty Acids (FISH OIL CONCENTRATE PO)      . cyclobenzaprine (FLEXERIL) 10 MG tablet Take 1 tablet (10 mg total) by mouth 3 (three) times daily as needed for muscle spasms. 30 tablet 0  . ketorolac (TORADOL) 10 MG tablet Take 1 tablet (10 mg total) by mouth every 6 (six) hours as needed. 20 tablet 0  . naproxen (NAPROSYN) 250 MG tablet Take by mouth as needed.     No facility-administered medications prior to visit.    ROS Review of Systems  Constitutional: Negative for appetite change, fatigue and unexpected weight change.  HENT: Negative for congestion, nosebleeds, sneezing, sore throat and trouble swallowing.   Eyes: Positive for pain. Negative for redness, itching and visual disturbance.  Respiratory: Negative for cough, chest tightness, shortness of breath and wheezing.   Cardiovascular: Positive for leg swelling. Negative for chest pain and palpitations.  Gastrointestinal: Negative for nausea, vomiting, diarrhea, blood in stool and abdominal distention.  Genitourinary: Negative for frequency and hematuria.  Musculoskeletal: Negative for back pain, joint swelling, arthralgias, gait problem and neck pain.  Skin: Negative for rash.  Neurological: Negative for dizziness, tremors, speech difficulty and weakness.  Psychiatric/Behavioral: Negative for sleep disturbance, dysphoric mood and agitation. The patient is not  nervous/anxious.     Objective:  BP 118/80 mmHg  Pulse 70  Temp(Src) 98.1 F (36.7 C) (Oral)  Ht 5\' 11"  (1.803 m)  Wt 240 lb 6 oz (109.033 kg)  BMI 33.54 kg/m2  SpO2 98%  BP Readings from Last 3 Encounters:  10/29/14 118/80  07/14/13 149/90  04/16/13 155/100    Wt Readings from Last 3 Encounters:  10/29/14 240 lb 6 oz (109.033 kg)  07/14/13 295 lb (133.811 kg)  04/16/13 300 lb 9.6 oz (136.351 kg)    Physical Exam  Constitutional: He is oriented to person, place, and time. He appears well-developed. No distress.  NAD  HENT:  Mouth/Throat: Oropharynx is clear and moist.  Eyes: Conjunctivae are normal. Pupils are equal, round, and reactive to light.  Neck: Normal range of motion. No JVD present. No thyromegaly present.  Cardiovascular: Normal rate, regular rhythm, normal heart sounds and intact distal pulses.  Exam reveals no gallop and no friction rub.   No murmur heard. Pulmonary/Chest: Effort normal and breath sounds normal. No respiratory distress. He has no wheezes. He has no rales. He exhibits no tenderness.  Abdominal: Soft. Bowel sounds are normal. He exhibits no distension and no mass. There is no tenderness. There is no rebound and no guarding.  Musculoskeletal: Normal range of motion. He exhibits edema. He exhibits no tenderness.  Lymphadenopathy:    He has no cervical adenopathy.  Neurological: He is alert and oriented to person, place, and time. He has normal reflexes. No cranial nerve deficit. He exhibits normal muscle  tone. He displays a negative Romberg sign. Coordination and gait normal.  Skin: Skin is warm and dry. No rash noted.  Psychiatric: He has a normal mood and affect. His behavior is normal. Judgment and thought content normal.  R ankle w/trace edema, calf is a little tight, sensitive, NT Pulses ok A stye  Lab Results  Component Value Date   WBC 9.7 10/29/2014   HGB 16.4 10/29/2014   HCT 48.4 10/29/2014   PLT 327.0 10/29/2014   GLUCOSE 100*  10/29/2014   NA 137 10/29/2014   K 4.4 10/29/2014   CL 102 10/29/2014   CREATININE 0.79 10/29/2014   BUN 26* 10/29/2014   CO2 29 10/29/2014   INR 1.1* 10/29/2014    Dg Foot Complete Left  06/17/2013   CLINICAL DATA History of gout, pain  EXAM LEFT FOOT - COMPLETE 3+ VIEW  COMPARISON None.  FINDINGS There is lucency involving the base of the proximal phalanx of the left great toe as well as the head of the left first metatarsal suspicious for small erosions possibly due to gout. No soft tissue calcification is seen. Only minimal degenerative change is present of the left first MTP joint. No other abnormality is seen.  IMPRESSION Questionable small erosions involving the left first MTP joint.  SIGNATURE  Electronically Signed   By: [name] M.D.   On: 06/17/2013 13:16   Dg Foot Complete Right  06/17/2013   CLINICAL DATA History of gout, pain  EXAM RIGHT FOOT COMPLETE - 3+ VIEW  COMPARISON Right foot films of 02/21/2010  FINDINGS There is primary degenerative change involving the right first MTP joint with some loss of joint space and spurring with sclerosis. No definite erosion is seen. The remainder of joint spaces appear normal. Tarsal -metatarsal alignment is normal.  IMPRESSION Degenerative change of the right first MTP joint.  No erosion.  SIGNATURE  Electronically Signed   By: [name] M.D.   On: 06/17/2013 13:17    Assessment & Plan:   Diagnoses and all orders for this visit:  Eye pain, left Orders: -     cephALEXin (KEFLEX) 500 MG capsule; Take 1 capsule (500 mg total) by mouth 4 (four) times daily. -     Cancel: UE VENOUS DUPLEX; Future -     D-dimer, quantitative (not at [facility]); Future -     CBC with Differential/Platelet; Future -     Basic metabolic panel; Future -     Protime-INR; Future  Right leg DVT  Acute idiopathic gout, unspecified site  Stye external, unspecified laterality  Other orders -     rivaroxaban (XARELTO) 20 MG TABS tablet; Take 1 tablet (20 mg  total) by mouth daily. -     Rivaroxaban (XARELTO) 15 MG TABS tablet; Take 1 tablet (15 mg total) by mouth 2 (two) times daily with a meal.  I have discontinued Mr. [name]'s ketorolac, cyclobenzaprine, and naproxen. I am also having him start on cephALEXin, rivaroxaban, and Rivaroxaban. Additionally, I am having him maintain his fosinopril, metoprolol succinate, Omega-3 Fatty Acids (FISH OIL CONCENTRATE PO), colchicine, and allopurinol.  Meds ordered this encounter  Medications  . colchicine 0.6 MG tablet    Sig: Take 0.6 mg by mouth daily.  Marland Kitchen allopurinol (ZYLOPRIM) 100 MG tablet    Sig: Take 100 mg by mouth daily.  . cephALEXin (KEFLEX) 500 MG capsule    Sig: Take 1 capsule (500 mg total) by mouth 4 (four) times daily.    Dispense:  28 capsule    Refill:  0  . rivaroxaban (XARELTO) 20 MG TABS tablet    Sig: Take 1 tablet (20 mg total) by mouth daily.    Dispense:  30 tablet    Refill:  5  . Rivaroxaban (XARELTO) 15 MG TABS tablet    Sig: Take 1 tablet (15 mg total) by mouth 2 (two) times daily with a meal.    Dispense:  28 tablet    Refill:  0     Follow-up: No Follow-up on file.  Walker Kehr, MD

## 2014-10-29 NOTE — Progress Notes (Signed)
Pre visit review using our clinic review tool, if applicable. No additional management support is needed unless otherwise documented below in the visit note. 

## 2014-10-30 DIAGNOSIS — I82401 Acute embolism and thrombosis of unspecified deep veins of right lower extremity: Secondary | ICD-10-CM | POA: Insufficient documentation

## 2014-10-30 DIAGNOSIS — H00019 Hordeolum externum unspecified eye, unspecified eyelid: Secondary | ICD-10-CM | POA: Insufficient documentation

## 2014-10-30 DIAGNOSIS — M109 Gout, unspecified: Secondary | ICD-10-CM | POA: Insufficient documentation

## 2014-10-30 NOTE — Assessment & Plan Note (Signed)
Recurrent  Colchicine, Allopurinol

## 2014-10-30 NOTE — Assessment & Plan Note (Signed)
Doppler US STAT D-dimer

## 2014-10-30 NOTE — Assessment & Plan Note (Signed)
No acute clot on [place]: the old clot is extensive; d-dimer is elevated. In the view of his recent extensive leg swelling and elevated D-dimer will treat as if it was an acute DVT w/Xarelto. Risks discussed. Leg elevation Ellastic sock

## 2014-10-30 NOTE — Assessment & Plan Note (Signed)
Keflex po

## 2014-11-01 ENCOUNTER — Telehealth: Payer: Self-pay | Admitting: *Deleted

## 2014-11-01 ENCOUNTER — Telehealth: Payer: Self-pay | Admitting: Internal Medicine

## 2014-11-01 MED ORDER — RIVAROXABAN 20 MG PO TABS: 20.0000 mg | ORAL_TABLET | Freq: Every day | ORAL | Status: DC

## 2014-11-01 NOTE — Addendum Note (Signed)
Addended by: Cassandria Anger on: 11/01/2014 05:04 PM   Modules accepted: Orders

## 2014-11-01 NOTE — Telephone Encounter (Signed)
New rx sent- see meds. Pt informed

## 2014-11-01 NOTE — Telephone Encounter (Signed)
-----   Message from Cassandria Anger, MD sent at 10/30/2014 11:20 PM EDT ----- Erline Levine, please, sch an appt for this pt in 3 wks Thx

## 2014-11-01 NOTE — Telephone Encounter (Signed)
Pt came by office today and wanted to make sure all his scripts are sent to mail order unless he specifies differently due to his insurance policy guidelines.  According to pt, he can get three scripts filled through a local pharmacy per year and the others are to be sent through mail order.  Please update his pharmacy information to:  [facility] [phone], [place], [place], [place] [address].  Dr. Alain Marion gave pt two bottles of sample Xarelto 15 mg for him to have until our office can send in the request to mail order.  He does want our office to handle sending in the script for Xarelto because he thinks he will get it quicker than if he submits it.

## 2014-11-01 NOTE — Telephone Encounter (Signed)
Left detailed mess informing pt to call and schedule 3 week f/u per PCP.

## 2014-11-02 ENCOUNTER — Ambulatory Visit: Payer: PRIVATE HEALTH INSURANCE | Admitting: Internal Medicine

## 2014-11-03 ENCOUNTER — Encounter: Payer: Self-pay | Admitting: Internal Medicine

## 2014-11-23 ENCOUNTER — Ambulatory Visit (INDEPENDENT_AMBULATORY_CARE_PROVIDER_SITE_OTHER): Payer: PRIVATE HEALTH INSURANCE | Admitting: Internal Medicine

## 2014-11-23 ENCOUNTER — Encounter: Payer: Self-pay | Admitting: Internal Medicine

## 2014-11-23 ENCOUNTER — Other Ambulatory Visit (INDEPENDENT_AMBULATORY_CARE_PROVIDER_SITE_OTHER): Payer: PRIVATE HEALTH INSURANCE

## 2014-11-23 VITALS — BP 120/78 | HR 66 | Wt 250.0 lb

## 2014-11-23 DIAGNOSIS — I82401 Acute embolism and thrombosis of unspecified deep veins of right lower extremity: Secondary | ICD-10-CM

## 2014-11-23 DIAGNOSIS — M79604 Pain in right leg: Secondary | ICD-10-CM | POA: Diagnosis not present

## 2014-11-23 LAB — CBC WITH DIFFERENTIAL/PLATELET
BASOS PCT: 0.3 % (ref 0.0–3.0)
Basophils Absolute: 0 10*3/uL (ref 0.0–0.1)
Eosinophils Absolute: 0.1 10*3/uL (ref 0.0–0.7)
Eosinophils Relative: 2.2 % (ref 0.0–5.0)
HCT: 44.8 % (ref 39.0–52.0)
Hemoglobin: 15.2 g/dL (ref 13.0–17.0)
LYMPHS ABS: 1.4 10*3/uL (ref 0.7–4.0)
Lymphocytes Relative: 21.9 % (ref 12.0–46.0)
MCHC: 34 g/dL (ref 30.0–36.0)
MCV: 88.4 fl (ref 78.0–100.0)
MONO ABS: 0.6 10*3/uL (ref 0.1–1.0)
Monocytes Relative: 9.1 % (ref 3.0–12.0)
NEUTROS ABS: 4.3 10*3/uL (ref 1.4–7.7)
NEUTROS PCT: 66.5 % (ref 43.0–77.0)
PLATELETS: 218 10*3/uL (ref 150.0–400.0)
RBC: 5.06 Mil/uL (ref 4.22–5.81)
RDW: 16 % — AB (ref 11.5–15.5)
WBC: 6.4 10*3/uL (ref 4.0–10.5)

## 2014-11-23 LAB — BASIC METABOLIC PANEL
BUN: 20 mg/dL (ref 6–23)
CALCIUM: 9.9 mg/dL (ref 8.4–10.5)
CO2: 29 mEq/L (ref 19–32)
Chloride: 104 mEq/L (ref 96–112)
Creatinine, Ser: 0.68 mg/dL (ref 0.40–1.50)
GFR: 128.25 mL/min (ref >60.00)
GLUCOSE: 138 mg/dL — AB (ref 70–99)
Potassium: 5 mEq/L (ref 3.5–5.1)
SODIUM: 138 meq/L (ref 135–145)

## 2014-11-23 LAB — D-DIMER, QUANTITATIVE: D-Dimer, Quant: 0.7 ug/mL-FEU — ABNORMAL HIGH (ref 0.00–0.48)

## 2014-11-23 NOTE — Assessment & Plan Note (Signed)
Pain has resolved Hem ref Labs

## 2014-11-23 NOTE — Assessment & Plan Note (Addendum)
   Leg elevation Ellastic sock  Hem consult Dr Beryle Beams  Labs

## 2014-11-23 NOTE — Progress Notes (Signed)
Pre visit review using our clinic review tool, if applicable. No additional management support is needed unless otherwise documented below in the visit note. 

## 2014-11-23 NOTE — Progress Notes (Signed)
Subjective:  Patient ID: [name], male    DOB: 1960/10/17  Age: 54 y.o. MRN: [id number]  CC: No chief complaint on file.   HPI Jaqua Ching presents for RLE DVT f/u. Doing better.  Outpatient Prescriptions Prior to Visit  Medication Sig Dispense Refill  . allopurinol (ZYLOPRIM) 100 MG tablet Take 100 mg by mouth daily.    . cephALEXin (KEFLEX) 500 MG capsule Take 1 capsule (500 mg total) by mouth 4 (four) times daily. 28 capsule 0  . colchicine 0.6 MG tablet Take 0.6 mg by mouth daily.    . fosinopril (MONOPRIL) 10 MG tablet Take 10 mg by mouth daily.      . metoprolol succinate (TOPROL-XL) 25 MG 24 hr tablet      . Omega-3 Fatty Acids (FISH OIL CONCENTRATE PO)      . rivaroxaban (XARELTO) 20 MG TABS tablet Take 1 tablet (20 mg total) by mouth daily. 90 tablet 1  . Rivaroxaban (XARELTO) 15 MG TABS tablet Take 1 tablet (15 mg total) by mouth 2 (two) times daily with a meal. 28 tablet 0   No facility-administered medications prior to visit.    ROS Review of Systems  Constitutional: Negative for appetite change, fatigue and unexpected weight change.  HENT: Negative for congestion, nosebleeds, sneezing, sore throat and trouble swallowing.   Eyes: Negative for itching and visual disturbance.  Respiratory: Negative for cough.   Cardiovascular: Positive for leg swelling. Negative for chest pain and palpitations.  Gastrointestinal: Negative for nausea, diarrhea, blood in stool and abdominal distention.  Genitourinary: Negative for frequency and hematuria.  Musculoskeletal: Negative for back pain, joint swelling, gait problem and neck pain.  Skin: Negative for rash.  Neurological: Negative for dizziness, tremors, speech difficulty and weakness.  Psychiatric/Behavioral: Negative for sleep disturbance, dysphoric mood and agitation. The patient is not nervous/anxious.     Objective:  BP 120/78 mmHg  Pulse 66  Wt 250 lb (113.399 kg)  SpO2 96%  BP Readings from Last 3 Encounters:   11/23/14 120/78  10/29/14 118/80  07/14/13 149/90    Wt Readings from Last 3 Encounters:  11/23/14 250 lb (113.399 kg)  10/29/14 240 lb 6 oz (109.033 kg)  07/14/13 295 lb (133.811 kg)    Physical Exam  Constitutional: He is oriented to person, place, and time. He appears well-developed. No distress.  NAD  HENT:  Mouth/Throat: Oropharynx is clear and moist.  Eyes: Conjunctivae are normal. Pupils are equal, round, and reactive to light.  Neck: Normal range of motion. No JVD present. No thyromegaly present.  Cardiovascular: Normal rate, regular rhythm, normal heart sounds and intact distal pulses.  Exam reveals no gallop and no friction rub.   No murmur heard. Pulmonary/Chest: Effort normal and breath sounds normal. No respiratory distress. He has no wheezes. He has no rales. He exhibits no tenderness.  Abdominal: Soft. Bowel sounds are normal. He exhibits no distension and no mass. There is no tenderness. There is no rebound and no guarding.  Musculoskeletal: Normal range of motion. He exhibits no edema or tenderness.  Lymphadenopathy:    He has no cervical adenopathy.  Neurological: He is alert and oriented to person, place, and time. He has normal reflexes. No cranial nerve deficit. He exhibits normal muscle tone. He displays a negative Romberg sign. Coordination and gait normal.  Skin: Skin is warm and dry. No rash noted.  Psychiatric: He has a normal mood and affect. His behavior is normal. Judgment and thought content normal.  RLE w/trace edema, NT  Lab Results  Component Value Date   WBC 6.4 11/23/2014   HGB 15.2 11/23/2014   HCT 44.8 11/23/2014   PLT 218.0 11/23/2014   GLUCOSE 138* 11/23/2014   NA 138 11/23/2014   K 5.0 11/23/2014   CL 104 11/23/2014   CREATININE 0.68 11/23/2014   BUN 20 11/23/2014   CO2 29 11/23/2014   INR 1.1* 10/29/2014    Dg Foot Complete Left  06/17/2013   CLINICAL DATA History of gout, pain  EXAM LEFT FOOT - COMPLETE 3+ VIEW  COMPARISON  None.  FINDINGS There is lucency involving the base of the proximal phalanx of the left great toe as well as the head of the left first metatarsal suspicious for small erosions possibly due to gout. No soft tissue calcification is seen. Only minimal degenerative change is present of the left first MTP joint. No other abnormality is seen.  IMPRESSION Questionable small erosions involving the left first MTP joint.  SIGNATURE  Electronically Signed   By: Ivar Drape M.D.   On: 06/17/2013 13:16   Dg Foot Complete Right  06/17/2013   CLINICAL DATA History of gout, pain  EXAM RIGHT FOOT COMPLETE - 3+ VIEW  COMPARISON Right foot films of 02/21/2010  FINDINGS There is primary degenerative change involving the right first MTP joint with some loss of joint space and spurring with sclerosis. No definite erosion is seen. The remainder of joint spaces appear normal. Tarsal -metatarsal alignment is normal.  IMPRESSION Degenerative change of the right first MTP joint.  No erosion.  SIGNATURE  Electronically Signed   By: Ivar Drape M.D.   On: 06/17/2013 13:17    Assessment & Plan:   Diagnoses and all orders for this visit:  Right leg DVT -     CBC with Differential/Platelet; Future -     Basic metabolic panel; Future -     D-dimer, quantitative (not at [facility]); Future -     Ambulatory referral to Hematology  Leg pain, right   I am having Mr. Mcquade maintain his fosinopril, metoprolol succinate, Omega-3 Fatty Acids (FISH OIL CONCENTRATE PO), colchicine, allopurinol, cephALEXin, and rivaroxaban.  No orders of the defined types were placed in this encounter.     Follow-up: Return in about 3 months (around 02/23/2015) for a follow-up visit.  Walker Kehr, MD

## 2014-11-25 ENCOUNTER — Encounter: Payer: Self-pay | Admitting: Internal Medicine

## 2014-12-07 ENCOUNTER — Encounter: Payer: Self-pay | Admitting: Oncology

## 2014-12-07 ENCOUNTER — Ambulatory Visit (INDEPENDENT_AMBULATORY_CARE_PROVIDER_SITE_OTHER): Payer: PRIVATE HEALTH INSURANCE | Admitting: Oncology

## 2014-12-07 VITALS — BP 136/88 | HR 66 | Temp 98.2°F | Ht 72.0 in | Wt 258.4 lb

## 2014-12-07 DIAGNOSIS — I82511 Chronic embolism and thrombosis of right femoral vein: Secondary | ICD-10-CM | POA: Diagnosis not present

## 2014-12-07 DIAGNOSIS — Z7901 Long term (current) use of anticoagulants: Secondary | ICD-10-CM | POA: Diagnosis not present

## 2014-12-07 DIAGNOSIS — I82401 Acute embolism and thrombosis of unspecified deep veins of right lower extremity: Secondary | ICD-10-CM

## 2014-12-07 NOTE — Patient Instructions (Signed)
Return visit end of October/or early November Lab in Dr Plotnikov's office 1 week before visit (repeat D-dimer)

## 2014-12-07 NOTE — Progress Notes (Signed)
Patient ID: [name], male   DOB: 12/19/1960, 54 y.o.   MRN: [id number] New Patient Hematology   Hughes Wyndham [id number] 1960/09/02 54 y.o. 12/07/2014  CC: Dr. Walker Kehr   Reason for referral: Advise on duration of anticoagulation   HPI:  54 year old man who has been in overall excellent health without any major medical or surgical illness. He does Architect work. Over the last few months he has had a number of long 14 hour drives down to [place] and additional drives up to [place]. About 7 months ago he started to have intermittent pain in his right calf. On 10/29/2014 he developed significant swelling of the calf and sought medical attention. A D-dimer was significantly elevated at 1.99. 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 He was started on anticoagulation with Xarelto. He has not had any cough, dyspnea, or hemoptysis. There is no history of trauma. He has no signs or symptoms of a collagen vascular disorder. He is up-to-date on his health maintenance and had a colonoscopy in April 2015 which was normal except for hyperplastic polyps. He used to weigh 300 pounds. He went on a supervised tract medical diet and as part of the program he received small doses of human growth hormone. He was able to lose 70 pounds. He is a former 1-1/2 pack per day cigarette smoker but he stopped smoking almost 20 years ago at age 66. There is no family history of blood clots. His father died of complications of lung cancer at age 35 and he lost a brother at age 32 also with lung cancer. Both his father and his brother were heavy smokers. He has a sister age 67, brothers aged 11 and age 51 none of whom have had problems with blood clots. Mother is still alive at age 70 with dementia. He has one son age 29 overall healthy except for kidney stones.   PMH: Past Medical History  Diagnosis Date  . Lower back  pain   . Achilles tendonitis   . Sprain and strain of calcaneofibular ligament   . Hypertension   . H/O hiatal hernia   . Arthritis     lower back  Gout. Hiatal hernia with reflux. No history of MI, ulcers, hepatitis, yellow jaundice, thyroid disease, seizure, stroke.  Past Surgical History  Procedure Laterality Date  . Colonoscopy N/A 07/14/2013    Procedure: COLONOSCOPY;  Surgeon: Garlan Fair, MD;  Location: [facility] ENDOSCOPY;  Service: Endoscopy;  Laterality: N/A;   no prior major surgery.  Allergies: Allergies  Allergen Reactions  . Sulfonamide Derivatives     Medications:  Current outpatient prescriptions:  .  allopurinol (ZYLOPRIM) 100 MG tablet, Take 100 mg by mouth daily., Disp: , Rfl:  .  cephALEXin (KEFLEX) 500 MG capsule, Take 1 capsule (500 mg total) by mouth 4 (four) times daily., Disp: 28 capsule, Rfl: 0 .  colchicine 0.6 MG tablet, Take 0.6 mg by mouth daily., Disp: , Rfl:  .  fosinopril (MONOPRIL) 10 MG tablet, Take 10 mg by mouth daily.  , Disp: , Rfl:  .  metoprolol succinate (TOPROL-XL) 25 MG 24 hr tablet,  , Disp: , Rfl:  .  Omega-3 Fatty Acids (FISH OIL CONCENTRATE PO),  , Disp: , Rfl:  .  rivaroxaban (XARELTO) 20 MG TABS tablet, Take 1 tablet (20 mg total) by mouth daily., Disp: 90 tablet, Rfl: 1  Social History:  He  works in Architect. Married. Wife is a Marine scientist and the chief Hydrologist of this hospital. They have a son age 48 who is healthy. He stopped smoking at age 1. Drinks occasional glass of wine. He does not drink beer since it precipitates gout attacks. He cannot eat shrimp for the same reason.  Family History: Family History  Problem Relation Age of Onset  . Cancer Father 80    lung cancer  . Cancer Brother     lung ca    Review of Systems: See HPI Remaining ROS negative.  Physical Exam: Blood pressure 136/88, pulse 66, temperature 98.2 F (36.8 C), temperature source Oral, height 6' (1.829 m), weight 258 lb 6.4 oz (117.209  kg), SpO2 100 %. Wt Readings from Last 3 Encounters:  12/07/14 258 lb 6.4 oz (117.209 kg)  11/23/14 250 lb (113.399 kg)  10/29/14 240 lb 6 oz (109.033 kg)     General appearance: well nourished Caucasian man HENNT: Pharynx no erythema, exudate, mass, or ulcer. No thyromegaly or thyroid nodules Lymph nodes: No cervical, supraclavicular, or axillary lymphadenopathy Breasts:  Lungs: Clear to auscultation, resonant to percussion throughout Heart: Regular rhythm, no murmur, no gallop, no rub, no click, no edema Abdomen: Soft, nontender, normal bowel sounds, no mass, no organomegaly Extremities: No edema, no calf tenderness Right calf 43.5 cm, left 41 cm Right ankle 26 cm, left 25 cm Musculoskeletal: no joint deformities GU: no testicular mass; no inguinal adenopathy Vascular: Carotid pulses 2+, no bruits, distal pulses: Dorsalis pedis 2+ symmetric Neurologic: Alert, oriented, PERRLA, optic discs sharp and vessels normal, no hemorrhage or exudate, cranial nerves grossly normal, motor strength 5 over 5, reflexes 1+ symmSee discussion above see discussion above foretric, upper body coordination normal, gait normal, Skin: No rash or ecchymosis    Lab Results: Lab Results  Component Value Date   WBC 6.4 11/23/2014   HGB 15.2 11/23/2014   HCT 44.8 11/23/2014   MCV 88.4 11/23/2014   PLT 218.0 11/23/2014     Chemistry      Component Value Date/Time   NA 138 11/23/2014 0955   K 5.0 11/23/2014 0955   CL 104 11/23/2014 0955   CO2 29 11/23/2014 0955   BUN 20 11/23/2014 0955   CREATININE 0.68 11/23/2014 0955      Component Value Date/Time   CALCIUM 9.9 11/23/2014 0955      Radiological Studies: See discussion above    Impression: I am going to consider this a provoked event given the history of multiple, long, car rides and in the absence of any suspicion for occult malignancy or  a family history of clotting.   Recommend: Full anticoagulation for 3 months then  re-evaluate. If d-dimer negative at that point, no further anticoagulation; if positive, extend anticoagulation for another 3 months then re-evaluate. We no longer recommend doing "hypercoag panels" if results will not change clinical management    Annia Belt, MD 12/07/2014, 5:00 PM

## 2015-02-01 ENCOUNTER — Encounter: Payer: Self-pay | Admitting: Oncology

## 2015-02-01 ENCOUNTER — Ambulatory Visit (INDEPENDENT_AMBULATORY_CARE_PROVIDER_SITE_OTHER): Payer: PRIVATE HEALTH INSURANCE | Admitting: Oncology

## 2015-02-01 VITALS — BP 119/77 | HR 66 | Temp 98.2°F | Ht 72.0 in | Wt 272.2 lb

## 2015-02-01 DIAGNOSIS — I82401 Acute embolism and thrombosis of unspecified deep veins of right lower extremity: Secondary | ICD-10-CM | POA: Diagnosis not present

## 2015-02-01 LAB — D-DIMER, QUANTITATIVE (NOT AT ARMC): D DIMER QUANT: 0.35 ug{FEU}/mL (ref 0.00–0.48)

## 2015-02-01 NOTE — Progress Notes (Signed)
Patient ID: Maricela Kawahara, male   DOB: October 21, 1960, 54 y.o.   MRN: [id number] Hematology and Oncology Follow Up Visit  Arya Boxley [id number] 1960/11/22 54 y.o. 02/01/2015 9:31 AM   Principle Diagnosis: Encounter Diagnosis  Name Primary?  . 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 No cardiorespiratory symptoms. No history of trauma. No signs or symptoms of a collagen vascular disorder. No constitutional symptoms. Normal physical exam. No family history of any blood clots. CBC was normal essentially excluding a myeloproliferative process. My impression was that this was a provoked event and that short-term anticoagulation was indicated. D-dimer repeat testing done on August 16 was trending down at 0.7 units. He has now been on anticoagulation with Rivaroxaban since late July 2016. He is still getting intermittent discomfort in his right calf especially if he is on his feet for more than a few hours. He is wearing elastic stocking. He denies any dyspnea, chest pain, or palpitations.  Medications: reviewed  Allergies:  Allergies  Allergen Reactions  . Sulfonamide Derivatives     Review of Systems: See HPI Remaining ROS negative:   Physical Exam: Blood pressure 119/77, pulse 66, temperature 98.2 F (36.8 C), temperature source Oral, height 6' (1.829 m), weight 272 lb 3.2 oz (123.469 kg), SpO2 100 %. Wt Readings from Last 3 Encounters:  02/01/15 272 lb 3.2 oz (123.469 kg)  12/07/14 258 lb 6.4 oz (117.209 kg)  11/23/14 250 lb (113.399 kg)      General appearance: well nourished Caucasian man HENNT: Pharynx no erythema, exudate, mass, or ulcer. No thyromegaly or thyroid nodules Lymph nodes: No cervical, supraclavicular, or axillary lymphadenopathy Breasts:  Lungs: Clear to auscultation, resonant to percussion throughout Heart: Regular rhythm, no murmur, no gallop, no rub, no click, no edema Abdomen: Soft, nontender, normal bowel sounds, no mass, no organomegaly Extremities: No edema, no calf tenderness Calf measurements: right 43 cm, left 43 cm; right ankle 26 cm, left 25 Musculoskeletal: no joint deformities GU:  Vascular: Carotid pulses 2+, no bruits,  Neurologic: Alert, oriented, PERRLA,  cranial nerves grossly normal, motor strength 5 over 5, reflexes 1+ symmetric, upper body coordination normal, gait normal, Skin: No rash or ecchymosis  Lab Results: CBC W/Diff    Component Value Date/Time   WBC 6.4 11/23/2014 0955   RBC 5.06 11/23/2014 0955   HGB 15.2 11/23/2014 0955   HCT 44.8 11/23/2014 0955   PLT 218.0 11/23/2014 0955   MCV 88.4 11/23/2014 0955   MCHC 34.0 11/23/2014 0955   RDW 16.0* 11/23/2014 0955   LYMPHSABS 1.4 11/23/2014 0955   MONOABS 0.6 11/23/2014 0955   EOSABS 0.1 11/23/2014 0955   BASOSABS 0.0 11/23/2014 0955     Chemistry      Component Value Date/Time   NA 138 11/23/2014 0955   K 5.0 11/23/2014 0955   CL 104 11/23/2014 0955   CO2 29 11/23/2014 0955   BUN 20 11/23/2014 0955   CREATININE 0.68 11/23/2014 0955      Component Value Date/Time   CALCIUM 9.9 11/23/2014 0955  Radiological Studies: No results found.  Impression:  Extensive DVT right lower extremity discovered 10/29/2014-acute on chronic, likely provoked related to multiple episodes of prolonged travel.  Plan: I am repeating a d-dimer today. If it is normal, we will stop anticoagulation when he has completed 3 full months. If it remains elevated, extend for an additional 3 months and then reevaluate. We discussed  a strategy for the future: Low-dose aspirin, 81 mg, can reduce recurrence risk by about 30% which in somebody at low risk for recurrence is a reasonable option. It will also decrease formation of colon polyps as a secondary benefit. I would recommend that he use full dose anticoagulation with Rivaroxaban whenever he takes a long trip. Since the drug becomes therapeutic within 5 hours of the first dose, he could start on the day prior to the trip and stop when he gets home. He should only require routine thromboprophylaxis around surgical procedures.   CC: Patient Care Team: Cassandria Anger, MD as PCP - General (Internal Medicine)   Annia Belt, MD 10/25/20169:31 AM

## 2015-02-01 NOTE — Patient Instructions (Signed)
To lab today Return visit 1 year  

## 2015-02-02 ENCOUNTER — Telehealth: Payer: Self-pay | Admitting: *Deleted

## 2015-02-02 NOTE — Telephone Encounter (Signed)
-----   Message from Annia Belt, MD sent at 02/01/2015  1:27 PM EDT ----- Call pt: d-dimer now normal

## 2015-02-02 NOTE — Telephone Encounter (Signed)
Pt called - no answer ; left message D-dimer back to normal (@ 0.35)  per Dr Beryle Beams. ANd to call if he has any questions.

## 2015-02-08 ENCOUNTER — Ambulatory Visit (INDEPENDENT_AMBULATORY_CARE_PROVIDER_SITE_OTHER): Payer: PRIVATE HEALTH INSURANCE | Admitting: Internal Medicine

## 2015-02-08 ENCOUNTER — Encounter: Payer: Self-pay | Admitting: Internal Medicine

## 2015-02-08 VITALS — BP 138/80 | HR 81 | Wt 273.0 lb

## 2015-02-08 DIAGNOSIS — M1000 Idiopathic gout, unspecified site: Secondary | ICD-10-CM | POA: Diagnosis not present

## 2015-02-08 DIAGNOSIS — I82401 Acute embolism and thrombosis of unspecified deep veins of right lower extremity: Secondary | ICD-10-CM

## 2015-02-08 DIAGNOSIS — I10 Essential (primary) hypertension: Secondary | ICD-10-CM

## 2015-02-08 MED ORDER — RIVAROXABAN 20 MG PO TABS: ORAL_TABLET | ORAL | Status: DC

## 2015-02-08 MED ORDER — ALLOPURINOL 100 MG PO TABS: 100.0000 mg | ORAL_TABLET | Freq: Every day | ORAL | Status: DC

## 2015-02-08 MED ORDER — METOPROLOL SUCCINATE ER 25 MG PO TB24: 25.0000 mg | ORAL_TABLET | Freq: Every day | ORAL | Status: DC

## 2015-02-08 MED ORDER — FOSINOPRIL SODIUM 10 MG PO TABS: 10.0000 mg | ORAL_TABLET | Freq: Every day | ORAL | Status: DC

## 2015-02-08 MED ORDER — COLCHICINE 0.6 MG PO TABS: 0.6000 mg | ORAL_TABLET | Freq: Every day | ORAL | Status: DC

## 2015-02-08 NOTE — Progress Notes (Signed)
Subjective:  Patient ID: [name], male    DOB: [birth year]  Age: 54 y.o. MRN: [id number]  CC: No chief complaint on file.   HPI [name] presents for RLE DVT, gout, HTN f/u  Outpatient Prescriptions Prior to Visit  Medication Sig Dispense Refill  . Omega-3 Fatty Acids (FISH OIL CONCENTRATE PO)      . allopurinol (ZYLOPRIM) 100 MG tablet Take 100 mg by mouth daily.    . cephALEXin (KEFLEX) 500 MG capsule Take 1 capsule (500 mg total) by mouth 4 (four) times daily. 28 capsule 0  . colchicine 0.6 MG tablet Take 0.6 mg by mouth daily.    . fosinopril (MONOPRIL) 10 MG tablet Take 10 mg by mouth daily.      . metoprolol succinate (TOPROL-XL) 25 MG 24 hr tablet      . rivaroxaban (XARELTO) 20 MG TABS tablet Take 1 tablet (20 mg total) by mouth daily. 90 tablet 1   No facility-administered medications prior to visit.    ROS Review of Systems  Constitutional: Negative for appetite change, fatigue and unexpected weight change.  HENT: Negative for congestion, nosebleeds, sneezing, sore throat and trouble swallowing.   Eyes: Negative for itching and visual disturbance.  Respiratory: Negative for cough.   Cardiovascular: Negative for chest pain, palpitations and leg swelling.  Gastrointestinal: Negative for nausea, diarrhea, blood in stool and abdominal distention.  Genitourinary: Negative for frequency and hematuria.  Musculoskeletal: Negative for back pain, joint swelling, gait problem and neck pain.  Skin: Negative for rash.  Neurological: Negative for dizziness, tremors, speech difficulty and weakness.  Psychiatric/Behavioral: Negative for sleep disturbance, dysphoric mood and agitation. The patient is not nervous/anxious.     Objective:  BP 138/80 mmHg  Pulse 81  Wt 273 lb (123.832 kg)  SpO2 97%  BP Readings from Last 3 Encounters:  02/08/15 138/80  02/01/15 119/77  12/07/14 136/88    Wt Readings from Last 3 Encounters:  02/08/15 273 lb (123.832 kg)  02/01/15 272  lb 3.2 oz (123.469 kg)  12/07/14 258 lb 6.4 oz (117.209 kg)    Physical Exam  Constitutional: He is oriented to person, place, and time. He appears well-developed. No distress.  NAD  HENT:  Mouth/Throat: Oropharynx is clear and moist.  Eyes: Conjunctivae are normal. Pupils are equal, round, and reactive to light.  Neck: Normal range of motion. No JVD present. No thyromegaly present.  Cardiovascular: Normal rate, regular rhythm, normal heart sounds and intact distal pulses.  Exam reveals no gallop and no friction rub.   No murmur heard. Pulmonary/Chest: Effort normal and breath sounds normal. No respiratory distress. He has no wheezes. He has no rales. He exhibits no tenderness.  Abdominal: Soft. Bowel sounds are normal. He exhibits no distension and no mass. There is no tenderness. There is no rebound and no guarding.  Musculoskeletal: Normal range of motion. He exhibits edema. He exhibits no tenderness.  Lymphadenopathy:    He has no cervical adenopathy.  Neurological: He is alert and oriented to person, place, and time. He has normal reflexes. No cranial nerve deficit. He exhibits normal muscle tone. He displays a negative Romberg sign. Coordination and gait normal.  Skin: Skin is warm and dry. No rash noted.  Psychiatric: He has a normal mood and affect. His behavior is normal. Judgment and thought content normal.  RLE edema, NT  Lab Results  Component Value Date   WBC 6.4 11/23/2014   HGB 15.2 11/23/2014   HCT 44.8 11/23/2014  PLT 218.0 11/23/2014   GLUCOSE 138* 11/23/2014   NA 138 11/23/2014   K 5.0 11/23/2014   CL 104 11/23/2014   CREATININE 0.68 11/23/2014   BUN 20 11/23/2014   CO2 29 11/23/2014   INR 1.1* 10/29/2014    Dg Foot Complete Left  06/17/2013  CLINICAL DATA History of gout, pain EXAM LEFT FOOT - COMPLETE 3+ VIEW COMPARISON None. FINDINGS There is lucency involving the base of the proximal phalanx of the left great toe as well as the head of the left first  metatarsal suspicious for small erosions possibly due to gout. No soft tissue calcification is seen. Only minimal degenerative change is present of the left first MTP joint. No other abnormality is seen. IMPRESSION Questionable small erosions involving the left first MTP joint. SIGNATURE Electronically Signed   By: Ivar Drape M.D.   On: 06/17/2013 13:16   Dg Foot Complete Right  06/17/2013  CLINICAL DATA History of gout, pain EXAM RIGHT FOOT COMPLETE - 3+ VIEW COMPARISON Right foot films of 02/21/2010 FINDINGS There is primary degenerative change involving the right first MTP joint with some loss of joint space and spurring with sclerosis. No definite erosion is seen. The remainder of joint spaces appear normal. Tarsal -metatarsal alignment is normal. IMPRESSION Degenerative change of the right first MTP joint.  No erosion. SIGNATURE Electronically Signed   By: Ivar Drape M.D.   On: 06/17/2013 13:17    Assessment & Plan:   Diagnoses and all orders for this visit:  Deep vein thrombosis (DVT) of right lower extremity, unspecified chronicity, unspecified vein (HCC)  Other orders -     allopurinol (ZYLOPRIM) 100 MG tablet; Take 1 tablet (100 mg total) by mouth daily. -     colchicine 0.6 MG tablet; Take 1 tablet (0.6 mg total) by mouth daily. -     fosinopril (MONOPRIL) 10 MG tablet; Take 1 tablet (10 mg total) by mouth daily. -     metoprolol succinate (TOPROL-XL) 25 MG 24 hr tablet; Take 1 tablet (25 mg total) by mouth daily. -     rivaroxaban (XARELTO) 20 MG TABS tablet; 1 po qd as needed for traveling  I have discontinued Mr. Marlowe's cephALEXin. I have also changed his allopurinol, colchicine, fosinopril, metoprolol succinate, and rivaroxaban. Additionally, I am having him maintain his Omega-3 Fatty Acids (FISH OIL CONCENTRATE PO).  Meds ordered this encounter  Medications  . allopurinol (ZYLOPRIM) 100 MG tablet    Sig: Take 1 tablet (100 mg total) by mouth daily.    Dispense:  90 tablet     Refill:  3  . colchicine 0.6 MG tablet    Sig: Take 1 tablet (0.6 mg total) by mouth daily.    Dispense:  90 tablet    Refill:  3  . fosinopril (MONOPRIL) 10 MG tablet    Sig: Take 1 tablet (10 mg total) by mouth daily.    Dispense:  90 tablet    Refill:  3  . metoprolol succinate (TOPROL-XL) 25 MG 24 hr tablet    Sig: Take 1 tablet (25 mg total) by mouth daily.    Dispense:  90 tablet    Refill:  3  . rivaroxaban (XARELTO) 20 MG TABS tablet    Sig: 1 po qd as needed for traveling    Dispense:  90 tablet    Refill:  0     Follow-up: Return in about 1 year (around 02/08/2016) for Wellness Exam.  Walker Kehr, MD

## 2015-02-08 NOTE — Assessment & Plan Note (Addendum)
7/16 new Dx Dr Beryle Beams 10/16 use Xarelto prn for travel.  No acute clot on [place]: the old clot is extensive; d-dimer was elevated. In the view of his recent extensive leg swelling and elevated D-dimer will treat as if it was an acute DVT w/Xarelto. Risks discussed. Leg elevation Ellastic sock Loose wt

## 2015-02-08 NOTE — Progress Notes (Signed)
Pre visit review using our clinic review tool, if applicable. No additional management support is needed unless otherwise documented below in the visit note. 

## 2015-02-09 ENCOUNTER — Encounter: Payer: Self-pay | Admitting: Internal Medicine

## 2015-02-09 DIAGNOSIS — I10 Essential (primary) hypertension: Secondary | ICD-10-CM | POA: Insufficient documentation

## 2015-02-09 NOTE — Assessment & Plan Note (Signed)
Recurrent  Colchicine, Allopurinol 

## 2015-02-09 NOTE — Assessment & Plan Note (Signed)
Toprol, Monopril Loose wt

## 2015-05-26 ENCOUNTER — Telehealth: Payer: Self-pay | Admitting: *Deleted

## 2015-05-26 NOTE — Telephone Encounter (Signed)
Pt left msg on triage stating md told him to take 2 of the Allopurinol at his last visit, but when he sent rx it was for pnce a day. Requesting updated script to be sent to pharmacy...[name]

## 2015-05-27 MED ORDER — ALLOPURINOL 100 MG PO TABS: 100.0000 mg | ORAL_TABLET | Freq: Two times a day (BID) | ORAL | Status: DC

## 2015-05-27 NOTE — Telephone Encounter (Signed)
Notified pt rx sent to pharmacy../lmb 

## 2015-05-27 NOTE — Telephone Encounter (Signed)
Sorry - Rx changed Thx

## 2015-05-27 NOTE — Telephone Encounter (Signed)
Pls advisie on msg below...[name]

## 2016-02-07 ENCOUNTER — Ambulatory Visit (INDEPENDENT_AMBULATORY_CARE_PROVIDER_SITE_OTHER): Payer: PRIVATE HEALTH INSURANCE | Admitting: Oncology

## 2016-02-07 ENCOUNTER — Encounter: Payer: Self-pay | Admitting: Oncology

## 2016-02-07 VITALS — BP 134/81 | HR 67 | Temp 97.6°F | Wt 303.7 lb

## 2016-02-07 DIAGNOSIS — Z882 Allergy status to sulfonamides status: Secondary | ICD-10-CM

## 2016-02-07 DIAGNOSIS — I825Z1 Chronic embolism and thrombosis of unspecified deep veins of right distal lower extremity: Secondary | ICD-10-CM | POA: Diagnosis not present

## 2016-02-07 DIAGNOSIS — I82401 Acute embolism and thrombosis of unspecified deep veins of right lower extremity: Secondary | ICD-10-CM

## 2016-02-07 DIAGNOSIS — I825Y1 Chronic embolism and thrombosis of unspecified deep veins of right proximal lower extremity: Secondary | ICD-10-CM | POA: Diagnosis not present

## 2016-02-07 DIAGNOSIS — Z7982 Long term (current) use of aspirin: Secondary | ICD-10-CM

## 2016-02-07 NOTE — Patient Instructions (Signed)
Return as needed & good luck

## 2016-02-07 NOTE — Progress Notes (Signed)
Hematology and Oncology Follow Up Visit  [name] [id number] 1960/09/01 55 y.o. 02/07/2016 8:57 AM   Principle Diagnosis:  Provoked RLE DVT 7/16 Clinical summary: 56 year-old man who [place] which [place] multiple long car rides. 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 No cardiorespiratory symptoms. No history of trauma. No signs or symptoms of a collagen vascular disorder. No constitutional symptoms. Normal physical exam. No family history of any blood clots. CBC was normal essentially excluding a myeloproliferative process. My impression was that this was a provoked event and that short-term anticoagulation was indicated. D-dimer repeat testing done on August 16 was trending down at 0.7 units. Subsequent value done on 02/01/2015 now in normal range at 0.35. I recommended 3 months total anticoagulation. I then advised aspirin 81 mg daily maintenance thromboprophylaxis.  Interim history: He is doing well. He gets occasional discomfort and swelling in his right calf if he has been on his feet all day. No persistent or progressive symptoms. He denies any dyspnea, chest pain, or palpitations. He's had no other interim medical problems.  Medications: reviewed  Allergies:  Allergies  Allergen Reactions  . Sulfonamide Derivatives     Review of Systems: See Interim history Remaining ROS negative:   Physical Exam: Blood pressure 134/81, pulse 67, temperature 97.6 F (36.4 C), temperature source Oral, weight (!) 303 lb 11.2 oz (137.8 kg), SpO2 97 %. Wt Readings from Last 3 Encounters:  02/07/16 (!) 303 lb 11.2 oz (137.8 kg)  02/08/15 273 lb (123.8 kg)  02/01/15 272 lb 3.2 oz (123.5 kg)     General appearance: well nourished Caucasian man HENNT: Pharynx no  erythema, exudate, mass, or ulcer. No thyromegaly or thyroid nodules Lymph nodes: No cervical, supraclavicular, or axillary lymphadenopathy Breasts:  Lungs: Clear to auscultation, resonant to percussion throughout Heart: Regular rhythm, no murmur, no gallop, no rub, no click, no edema Abdomen: Soft, nontender, normal bowel sounds, no mass, no organomegaly Extremities: No edema, no calf tenderness Musculoskeletal: no joint deformities Right calf 45 cm. Left 44.5, right ankle 27 cm, left 26 cm. GU:  Vascular: Carotid pulses 2+, no bruits,  Neurologic: Alert, oriented, PERRLA,  e cranial nerves grossly normal, motor strength 5 over 5, reflexes 1+ symmetric, upper body coordination normal, gait normal, Skin: No rash or ecchymosis  Lab Results: CBC W/Diff    Component Value Date/Time   WBC 6.4 11/23/2014 0955   RBC 5.06 11/23/2014 0955   HGB 15.2 11/23/2014 0955   HCT 44.8 11/23/2014 0955   PLT 218.0 11/23/2014 0955   MCV 88.4 11/23/2014 0955   MCHC 34.0 11/23/2014 0955   RDW 16.0 (H) 11/23/2014 0955   LYMPHSABS 1.4 11/23/2014 0955   MONOABS 0.6 11/23/2014 0955   EOSABS 0.1 11/23/2014 0955   BASOSABS 0.0 11/23/2014 0955     Chemistry      Component Value Date/Time   NA 138 11/23/2014 0955   K 5.0 11/23/2014 0955   CL 104 11/23/2014 0955   CO2 29 11/23/2014 0955   BUN 20 11/23/2014 0955   CREATININE 0.68 11/23/2014 0955      Component Value Date/Time   CALCIUM 9.9 11/23/2014 0955       Radiological Studies: No results found.  Impression:  Provoked proximal and distal DVT right  leg.  He remained stable with no evidence for recurrent events now out 15 months following initial 3 months of anticoagulation with Xarelto and currently on aspirin 81 mg daily maintenance.  I will see him again on an as-needed basis. He is advised to go back on short-term Xarelto if he takes another long trip.  CC: Patient Care Team: Cassandria Anger, MD as PCP - General (Internal  Medicine)   Annia Belt, MD 10/31/20178:57 AM

## 2016-02-15 ENCOUNTER — Ambulatory Visit (INDEPENDENT_AMBULATORY_CARE_PROVIDER_SITE_OTHER): Payer: PRIVATE HEALTH INSURANCE | Admitting: Internal Medicine

## 2016-02-15 ENCOUNTER — Encounter: Payer: Self-pay | Admitting: Internal Medicine

## 2016-02-15 VITALS — BP 120/88 | HR 81 | Ht 72.0 in | Wt 297.0 lb

## 2016-02-15 DIAGNOSIS — I10 Essential (primary) hypertension: Secondary | ICD-10-CM

## 2016-02-15 DIAGNOSIS — IMO0002 Reserved for concepts with insufficient information to code with codable children: Secondary | ICD-10-CM | POA: Insufficient documentation

## 2016-02-15 DIAGNOSIS — I82401 Acute embolism and thrombosis of unspecified deep veins of right lower extremity: Secondary | ICD-10-CM | POA: Diagnosis not present

## 2016-02-15 DIAGNOSIS — Z0000 Encounter for general adult medical examination without abnormal findings: Secondary | ICD-10-CM | POA: Diagnosis not present

## 2016-02-15 DIAGNOSIS — M1000 Idiopathic gout, unspecified site: Secondary | ICD-10-CM

## 2016-02-15 DIAGNOSIS — R739 Hyperglycemia, unspecified: Secondary | ICD-10-CM

## 2016-02-15 DIAGNOSIS — E1165 Type 2 diabetes mellitus with hyperglycemia: Secondary | ICD-10-CM | POA: Insufficient documentation

## 2016-02-15 DIAGNOSIS — Z86010 Personal history of colonic polyps: Secondary | ICD-10-CM

## 2016-02-15 DIAGNOSIS — I4510 Unspecified right bundle-branch block: Secondary | ICD-10-CM

## 2016-02-15 MED ORDER — FOSINOPRIL SODIUM 10 MG PO TABS: 10.0000 mg | ORAL_TABLET | Freq: Every day | ORAL | 3 refills | Status: DC

## 2016-02-15 MED ORDER — ALLOPURINOL 100 MG PO TABS: 100.0000 mg | ORAL_TABLET | Freq: Two times a day (BID) | ORAL | 3 refills | Status: DC

## 2016-02-15 MED ORDER — POLYETHYLENE GLYCOL 3350 17 GM/SCOOP PO POWD: 17.0000 g | Freq: Every day | ORAL | 3 refills | Status: AC | PRN

## 2016-02-15 MED ORDER — METOPROLOL SUCCINATE ER 25 MG PO TB24: 25.0000 mg | ORAL_TABLET | Freq: Every day | ORAL | 3 refills | Status: DC

## 2016-02-15 MED ORDER — POLYETHYLENE GLYCOL 3350 17 GM/SCOOP PO POWD: 17.0000 g | Freq: Every day | ORAL | 3 refills | Status: DC | PRN

## 2016-02-15 NOTE — Assessment & Plan Note (Signed)
Toprol, Monopril 

## 2016-02-15 NOTE — Assessment & Plan Note (Signed)
No relapse 

## 2016-02-15 NOTE — Patient Instructions (Signed)
Saw [facility]

## 2016-02-15 NOTE — Progress Notes (Signed)
Pre visit review using our clinic review tool, if applicable. No additional management support is needed unless otherwise documented below in the visit note. 

## 2016-02-15 NOTE — Assessment & Plan Note (Addendum)
No sx's Card consult was suggested due to [name]'s hobby (scuba diving)

## 2016-02-15 NOTE — Assessment & Plan Note (Addendum)
We discussed age appropriate health related issues, including available/recomended screening tests and vaccinations. We discussed a need for adhering to healthy diet and exercise. Labs/EKG were reviewed/ordered. All questions were answered. Merry Proud will let me know re: colonoscopy

## 2016-02-15 NOTE — Assessment & Plan Note (Signed)
A1c

## 2016-02-15 NOTE — Assessment & Plan Note (Signed)
Colon q 5 years per pt's recollection

## 2016-02-15 NOTE — Progress Notes (Signed)
Subjective:  Patient ID: [name], male    DOB: [birth year]  Age: 55 y.o. MRN: [id number]  CC: Annual Exam   HPI [name] presents for a well exam H/o DVT  Outpatient Medications Prior to Visit  Medication Sig Dispense Refill  . allopurinol (ZYLOPRIM) 100 MG tablet Take 1 tablet (100 mg total) by mouth 2 (two) times daily. 180 tablet 3  . colchicine 0.6 MG tablet Take 1 tablet (0.6 mg total) by mouth daily. (Patient taking differently: Take 0.6 mg by mouth as needed. ) 90 tablet 3  . fosinopril (MONOPRIL) 10 MG tablet Take 1 tablet (10 mg total) by mouth daily. 90 tablet 3  . metoprolol succinate (TOPROL-XL) 25 MG 24 hr tablet Take 1 tablet (25 mg total) by mouth daily. 90 tablet 3  . Omega-3 Fatty Acids (FISH OIL CONCENTRATE PO)       No facility-administered medications prior to visit.     ROS Review of Systems  Constitutional: Negative for appetite change, fatigue and unexpected weight change.  HENT: Negative for congestion, nosebleeds, sneezing, sore throat and trouble swallowing.   Eyes: Negative for itching and visual disturbance.  Respiratory: Negative for cough.   Cardiovascular: Negative for chest pain, palpitations and leg swelling.  Gastrointestinal: Negative for abdominal distention, blood in stool, diarrhea and nausea.  Genitourinary: Negative for frequency and hematuria.  Musculoskeletal: Negative for back pain, gait problem, joint swelling and neck pain.  Skin: Negative for rash.  Neurological: Negative for dizziness, tremors, speech difficulty and weakness.  Psychiatric/Behavioral: Negative for agitation, dysphoric mood and sleep disturbance. The patient is not nervous/anxious.   Obese  Objective:  BP 120/88   Pulse 81   Ht 6' (1.829 m)   Wt 297 lb (134.7 kg)   SpO2 98%   BMI 40.28 kg/m   BP Readings from Last 3 Encounters:  02/15/16 120/88  02/07/16 134/81  02/08/15 138/80    Wt Readings from Last 3 Encounters:  02/15/16 297 lb (134.7 kg)    02/07/16 (!) 303 lb 11.2 oz (137.8 kg)  02/08/15 273 lb (123.8 kg)    Physical Exam  Constitutional: He is oriented to person, place, and time. He appears well-developed and well-nourished. No distress.  HENT:  Head: Normocephalic and atraumatic.  Right Ear: External ear normal.  Left Ear: External ear normal.  Nose: Nose normal.  Mouth/Throat: Oropharynx is clear and moist. No oropharyngeal exudate.  Eyes: Conjunctivae and EOM are normal. Pupils are equal, round, and reactive to light. Right eye exhibits no discharge. Left eye exhibits no discharge. No scleral icterus.  Neck: Normal range of motion. Neck supple. No JVD present. No tracheal deviation present. No thyromegaly present.  Cardiovascular: Normal rate, regular rhythm, normal heart sounds and intact distal pulses.  Exam reveals no gallop and no friction rub.   No murmur heard. Pulmonary/Chest: Effort normal and breath sounds normal. No stridor. No respiratory distress. He has no wheezes. He has no rales. He exhibits no tenderness.  Abdominal: Soft. Bowel sounds are normal. He exhibits no distension and no mass. There is no tenderness. There is no rebound and no guarding.  Genitourinary: Rectum normal, prostate normal and penis normal. Rectal exam shows guaiac negative stool. No penile tenderness.  Musculoskeletal: Normal range of motion. He exhibits no edema or tenderness.  Lymphadenopathy:    He has no cervical adenopathy.  Neurological: He is alert and oriented to person, place, and time. He has normal reflexes. No cranial nerve deficit. He exhibits normal  muscle tone. Coordination normal.  Skin: Skin is warm and dry. No rash noted. He is not diaphoretic. No erythema. No pallor.  Psychiatric: He has a normal mood and affect. His behavior is normal. Judgment and thought content normal.   Procedure: EKG Indication: well Impression: NSR. RBBB   Lab Results  Component Value Date   WBC 6.4 11/23/2014   HGB 15.2 11/23/2014    HCT 44.8 11/23/2014   PLT 218.0 11/23/2014   GLUCOSE 138 (H) 11/23/2014   NA 138 11/23/2014   K 5.0 11/23/2014   CL 104 11/23/2014   CREATININE 0.68 11/23/2014   BUN 20 11/23/2014   CO2 29 11/23/2014   INR 1.1 (H) 10/29/2014    Dg Foot Complete Left  Result Date: 06/17/2013 CLINICAL DATA History of gout, pain EXAM LEFT FOOT - COMPLETE 3+ VIEW COMPARISON None. FINDINGS There is lucency involving the base of the proximal phalanx of the left great toe as well as the head of the left first metatarsal suspicious for small erosions possibly due to gout. No soft tissue calcification is seen. Only minimal degenerative change is present of the left first MTP joint. No other abnormality is seen. IMPRESSION Questionable small erosions involving the left first MTP joint. SIGNATURE Electronically Signed   By: Ivar Drape M.D.   On: 06/17/2013 13:16   Dg Foot Complete Right  Result Date: 06/17/2013 CLINICAL DATA History of gout, pain EXAM RIGHT FOOT COMPLETE - 3+ VIEW COMPARISON Right foot films of 02/21/2010 FINDINGS There is primary degenerative change involving the right first MTP joint with some loss of joint space and spurring with sclerosis. No definite erosion is seen. The remainder of joint spaces appear normal. Tarsal -metatarsal alignment is normal. IMPRESSION Degenerative change of the right first MTP joint.  No erosion. SIGNATURE Electronically Signed   By: Ivar Drape M.D.   On: 06/17/2013 13:17    Assessment & Plan:   There are no diagnoses linked to this encounter. I am having [name] maintain his Omega-3 Fatty Acids (FISH OIL CONCENTRATE PO), colchicine, fosinopril, metoprolol succinate, allopurinol, and aspirin EC.  Meds ordered this encounter  Medications  . aspirin EC 81 MG tablet    Sig: Take 81 mg by mouth daily.     Follow-up: No Follow-up on file.  Walker Kehr, MD

## 2016-02-15 NOTE — Assessment & Plan Note (Signed)
Xarelto for travel

## 2016-02-16 ENCOUNTER — Telehealth: Payer: Self-pay | Admitting: Internal Medicine

## 2016-02-16 ENCOUNTER — Other Ambulatory Visit (INDEPENDENT_AMBULATORY_CARE_PROVIDER_SITE_OTHER): Payer: PRIVATE HEALTH INSURANCE

## 2016-02-16 DIAGNOSIS — R7989 Other specified abnormal findings of blood chemistry: Secondary | ICD-10-CM

## 2016-02-16 DIAGNOSIS — R739 Hyperglycemia, unspecified: Secondary | ICD-10-CM

## 2016-02-16 DIAGNOSIS — Z0000 Encounter for general adult medical examination without abnormal findings: Secondary | ICD-10-CM | POA: Diagnosis not present

## 2016-02-16 LAB — LIPID PANEL
CHOL/HDL RATIO: 7
CHOLESTEROL: 306 mg/dL — AB (ref 0–200)
HDL: 46.1 mg/dL (ref >39.00)
NONHDL: 259.61
Triglycerides: 283 mg/dL — ABNORMAL HIGH (ref 0.0–149.0)
VLDL: 56.6 mg/dL — ABNORMAL HIGH (ref 0.0–40.0)

## 2016-02-16 LAB — URINALYSIS
BILIRUBIN URINE: NEGATIVE
KETONES UR: NEGATIVE
Leukocytes, UA: NEGATIVE
Nitrite: NEGATIVE
PH: 5.5 (ref 5.0–8.0)
Specific Gravity, Urine: 1.02 (ref 1.000–1.030)
TOTAL PROTEIN, URINE-UPE24: NEGATIVE
Urobilinogen, UA: 0.2 (ref 0.0–1.0)

## 2016-02-16 LAB — BASIC METABOLIC PANEL
BUN: 21 mg/dL (ref 6–23)
CHLORIDE: 102 meq/L (ref 96–112)
CO2: 28 meq/L (ref 19–32)
CREATININE: 0.79 mg/dL (ref 0.40–1.50)
Calcium: 9.7 mg/dL (ref 8.4–10.5)
GFR: 107.4 mL/min (ref >60.00)
Glucose, Bld: 303 mg/dL — ABNORMAL HIGH (ref 70–99)
POTASSIUM: 5 meq/L (ref 3.5–5.1)
Sodium: 136 mEq/L (ref 135–145)

## 2016-02-16 LAB — CBC WITH DIFFERENTIAL/PLATELET
BASOS ABS: 0 10*3/uL (ref 0.0–0.1)
Basophils Relative: 0.2 % (ref 0.0–3.0)
Eosinophils Absolute: 0.2 10*3/uL (ref 0.0–0.7)
Eosinophils Relative: 2.4 % (ref 0.0–5.0)
HCT: 48.4 % (ref 39.0–52.0)
Hemoglobin: 16.6 g/dL (ref 13.0–17.0)
LYMPHS ABS: 1.7 10*3/uL (ref 0.7–4.0)
LYMPHS PCT: 27.7 % (ref 12.0–46.0)
MCHC: 34.3 g/dL (ref 30.0–36.0)
MCV: 87.7 fl (ref 78.0–100.0)
MONOS PCT: 9 % (ref 3.0–12.0)
Monocytes Absolute: 0.6 10*3/uL (ref 0.1–1.0)
NEUTROS PCT: 60.7 % (ref 43.0–77.0)
Neutro Abs: 3.8 10*3/uL (ref 1.4–7.7)
Platelets: 246 10*3/uL (ref 150.0–400.0)
RBC: 5.52 Mil/uL (ref 4.22–5.81)
RDW: 13.8 % (ref 11.5–15.5)
WBC: 6.3 10*3/uL (ref 4.0–10.5)

## 2016-02-16 LAB — LDL CHOLESTEROL, DIRECT: Direct LDL: 221 mg/dL

## 2016-02-16 LAB — HEPATIC FUNCTION PANEL
ALK PHOS: 70 U/L (ref 39–117)
ALT: 23 U/L (ref 0–53)
AST: 13 U/L (ref 0–37)
Albumin: 4.2 g/dL (ref 3.5–5.2)
BILIRUBIN DIRECT: 0.1 mg/dL (ref 0.0–0.3)
BILIRUBIN TOTAL: 0.7 mg/dL (ref 0.2–1.2)
Total Protein: 7 g/dL (ref 6.0–8.3)

## 2016-02-16 LAB — PSA: PSA: 4.07 ng/mL — AB (ref 0.10–4.00)

## 2016-02-16 LAB — TSH: TSH: 1.46 u[IU]/mL (ref 0.35–4.50)

## 2016-02-16 LAB — HEMOGLOBIN A1C: Hgb A1c MFr Bld: 10.6 % — ABNORMAL HIGH (ref 4.6–6.5)

## 2016-02-16 NOTE — Telephone Encounter (Signed)
Erline Levine,  Please see if we can see Merry Proud tomorrow at 4:30 for high sugar Thx

## 2016-02-17 ENCOUNTER — Encounter: Payer: Self-pay | Admitting: Internal Medicine

## 2016-02-17 ENCOUNTER — Ambulatory Visit (INDEPENDENT_AMBULATORY_CARE_PROVIDER_SITE_OTHER): Payer: PRIVATE HEALTH INSURANCE | Admitting: Internal Medicine

## 2016-02-17 VITALS — BP 126/88 | HR 72

## 2016-02-17 DIAGNOSIS — E785 Hyperlipidemia, unspecified: Secondary | ICD-10-CM

## 2016-02-17 DIAGNOSIS — R739 Hyperglycemia, unspecified: Secondary | ICD-10-CM

## 2016-02-17 DIAGNOSIS — I4510 Unspecified right bundle-branch block: Secondary | ICD-10-CM | POA: Diagnosis not present

## 2016-02-17 DIAGNOSIS — R972 Elevated prostate specific antigen [PSA]: Secondary | ICD-10-CM

## 2016-02-17 DIAGNOSIS — E1110 Type 2 diabetes mellitus with ketoacidosis without coma: Secondary | ICD-10-CM | POA: Diagnosis not present

## 2016-02-17 LAB — HEPATITIS C ANTIBODY: HCV Ab: NEGATIVE

## 2016-02-17 MED ORDER — ONETOUCH DELICA LANCETS FINE MISC: 1.0000 | Freq: Every day | 3 refills | Status: DC | PRN

## 2016-02-17 MED ORDER — GLUCOSE BLOOD VI STRP
ORAL_STRIP | 11 refills | Status: DC
Start: 2016-02-17 — End: 2016-03-16

## 2016-02-17 MED ORDER — METFORMIN HCL 500 MG PO TABS: 500.0000 mg | ORAL_TABLET | Freq: Two times a day (BID) | ORAL | 11 refills | Status: DC

## 2016-02-17 NOTE — Telephone Encounter (Signed)
Will you call and schedule pt for 4:30 today please.

## 2016-02-17 NOTE — Assessment & Plan Note (Signed)
Repeat free PSA

## 2016-02-17 NOTE — Progress Notes (Signed)
Pre visit review using our clinic review tool, if applicable. No additional management support is needed unless otherwise documented below in the visit note. 

## 2016-02-17 NOTE — Assessment & Plan Note (Signed)
Card ref 

## 2016-02-17 NOTE — Assessment & Plan Note (Signed)
started on Metformin Labs in 1 mo Discussed diet/meds with Merry Proud and Bethena Roys

## 2016-02-17 NOTE — Telephone Encounter (Signed)
Pt is coming in at 4:30

## 2016-02-17 NOTE — Progress Notes (Signed)
Subjective:  Patient ID: [name], male    DOB: [birth year]  Age: 55 y.o. MRN: [id number]  CC: No chief complaint on file.   HPI [name] presents for a new onset DM, elevated TG, elevated PSA, RBBB f/u  Outpatient Medications Prior to Visit  Medication Sig Dispense Refill  . allopurinol (ZYLOPRIM) 100 MG tablet Take 1 tablet (100 mg total) by mouth 2 (two) times daily. 180 tablet 3  . aspirin EC 81 MG tablet Take 81 mg by mouth daily.    . colchicine 0.6 MG tablet Take 1 tablet (0.6 mg total) by mouth daily. (Patient taking differently: Take 0.6 mg by mouth as needed. ) 90 tablet 3  . fosinopril (MONOPRIL) 10 MG tablet Take 1 tablet (10 mg total) by mouth daily. 90 tablet 3  . metoprolol succinate (TOPROL-XL) 25 MG 24 hr tablet Take 1 tablet (25 mg total) by mouth daily. 90 tablet 3  . Omega-3 Fatty Acids (FISH OIL CONCENTRATE PO)      . polyethylene glycol powder (GLYCOLAX/MIRALAX) powder Take 17 g by mouth daily as needed. 500 g 3   No facility-administered medications prior to visit.     ROS Review of Systems  Constitutional: Positive for unexpected weight change. Negative for fatigue.  Cardiovascular: Negative for chest pain, palpitations and leg swelling.  Gastrointestinal: Negative for blood in stool and diarrhea.  Endocrine: Positive for polyuria. Negative for cold intolerance.  Genitourinary: Negative for decreased urine volume, difficulty urinating, dysuria and frequency.  Musculoskeletal: Positive for back pain.  Neurological: Negative for dizziness, syncope and weakness.  Psychiatric/Behavioral: Negative for sleep disturbance. The patient is not nervous/anxious.     Objective:  BP 126/88   Pulse 72   SpO2 97%   BP Readings from Last 3 Encounters:  02/17/16 126/88  02/15/16 120/88  02/07/16 134/81    Wt Readings from Last 3 Encounters:  02/15/16 297 lb (134.7 kg)  02/07/16 (!) 303 lb 11.2 oz (137.8 kg)  02/08/15 273 lb (123.8 kg)    Physical Exam   Constitutional: He is oriented to person, place, and time. He appears well-developed. No distress.  NAD  HENT:  Mouth/Throat: Oropharynx is clear and moist.  Eyes: Conjunctivae are normal. Pupils are equal, round, and reactive to light.  Neck: Normal range of motion. No JVD present. No thyromegaly present.  Cardiovascular: Normal rate, regular rhythm, normal heart sounds and intact distal pulses.  Exam reveals no gallop and no friction rub.   No murmur heard. Pulmonary/Chest: Effort normal and breath sounds normal. No respiratory distress. He has no wheezes. He has no rales. He exhibits no tenderness.  Abdominal: Soft. Bowel sounds are normal. He exhibits no distension and no mass. There is no tenderness. There is no rebound and no guarding.  Musculoskeletal: Normal range of motion. He exhibits no edema or tenderness.  Lymphadenopathy:    He has no cervical adenopathy.  Neurological: He is alert and oriented to person, place, and time. He has normal reflexes. No cranial nerve deficit. He exhibits normal muscle tone. He displays a negative Romberg sign. Coordination and gait normal.  Skin: Skin is warm and dry. No rash noted.  Psychiatric: He has a normal mood and affect. His behavior is normal. Judgment and thought content normal.  obese  Lab Results  Component Value Date   WBC 6.3 02/16/2016   HGB 16.6 02/16/2016   HCT 48.4 02/16/2016   PLT 246.0 02/16/2016   GLUCOSE 303 (H) 02/16/2016   CHOL  306 (H) 02/16/2016   TRIG 283.0 (H) 02/16/2016   HDL 46.10 02/16/2016   LDLDIRECT 221.0 02/16/2016   ALT 23 02/16/2016   AST 13 02/16/2016   NA 136 02/16/2016   K 5.0 02/16/2016   CL 102 02/16/2016   CREATININE 0.79 02/16/2016   BUN 21 02/16/2016   CO2 28 02/16/2016   TSH 1.46 02/16/2016   PSA 4.07 (H) 02/16/2016   INR 1.1 (H) 10/29/2014   HGBA1C 10.6 (H) 02/16/2016    Dg Foot Complete Left  Result Date: 06/17/2013 CLINICAL DATA History of gout, pain EXAM LEFT FOOT - COMPLETE 3+  VIEW COMPARISON None. FINDINGS There is lucency involving the base of the proximal phalanx of the left great toe as well as the head of the left first metatarsal suspicious for small erosions possibly due to gout. No soft tissue calcification is seen. Only minimal degenerative change is present of the left first MTP joint. No other abnormality is seen. IMPRESSION Questionable small erosions involving the left first MTP joint. SIGNATURE Electronically Signed   By: Ivar Drape M.D.   On: 06/17/2013 13:16   Dg Foot Complete Right  Result Date: 06/17/2013 CLINICAL DATA History of gout, pain EXAM RIGHT FOOT COMPLETE - 3+ VIEW COMPARISON Right foot films of 02/21/2010 FINDINGS There is primary degenerative change involving the right first MTP joint with some loss of joint space and spurring with sclerosis. No definite erosion is seen. The remainder of joint spaces appear normal. Tarsal -metatarsal alignment is normal. IMPRESSION Degenerative change of the right first MTP joint.  No erosion. SIGNATURE Electronically Signed   By: Ivar Drape M.D.   On: 06/17/2013 13:17    Assessment & Plan:   There are no diagnoses linked to this encounter. I am having Mr. Cahalan maintain his Omega-3 Fatty Acids (FISH OIL CONCENTRATE PO), colchicine, aspirin EC, allopurinol, metoprolol succinate, fosinopril, and polyethylene glycol powder.  No orders of the defined types were placed in this encounter.    Follow-up: No Follow-up on file.  Walker Kehr, MD

## 2016-02-17 NOTE — Assessment & Plan Note (Signed)
Repeat labs:

## 2016-02-20 ENCOUNTER — Ambulatory Visit (INDEPENDENT_AMBULATORY_CARE_PROVIDER_SITE_OTHER): Payer: PRIVATE HEALTH INSURANCE | Admitting: Cardiology

## 2016-02-20 ENCOUNTER — Encounter: Payer: Self-pay | Admitting: Cardiology

## 2016-02-20 VITALS — BP 122/83 | HR 82 | Ht 73.5 in | Wt 299.8 lb

## 2016-02-20 DIAGNOSIS — R9431 Abnormal electrocardiogram [ECG] [EKG]: Secondary | ICD-10-CM | POA: Diagnosis not present

## 2016-02-20 DIAGNOSIS — R0602 Shortness of breath: Secondary | ICD-10-CM | POA: Diagnosis not present

## 2016-02-20 NOTE — Patient Instructions (Addendum)
Medication Instructions:  None Ordered  Labwork: None Ordered  Testing/Procedures: Your physician has requested that you have a Coronary Calcium Score Test. This test is done at our [facility].   Your physician has requested that you have an echocardiogram. Echocardiography is a painless test that uses sound waves to create images of your heart. It provides your doctor with information about the size and shape of your heart and how well your heart's chambers and valves are working. This procedure takes approximately one hour. There are no restrictions for this procedure.   Follow-Up: Your physician recommends that you schedule a follow-up appointment in: As Needed   Any Other Special Instructions Will Be Listed Below (If Applicable).          Happy Thanksgiving  If you need a refill on your cardiac medications before your next appointment, please call your pharmacy.

## 2016-02-20 NOTE — Progress Notes (Signed)
Cardiology Office Note   Date:  02/22/2016   ID:  [name], DOB [birth year], MRN [id number]  PCP:  Walker Kehr, MD  Cardiologist:   Minus Breeding, MD  Referring:  Walker Kehr, MD  Chief Complaint  Patient presents with  . Abnormal ECG      History of Present Illness: [name] is a 55 y.o. male who presents for the patient has no past cardiac history but was just diagnosed with diabetes and found to have RBBB.  He is limited in his activities because of back pain.  However, with his activities of daily living he denies any cardiovascular symptoms.  The patient denies any new symptoms such as chest discomfort, neck or arm discomfort. There has been no new shortness of breath, PND or orthopnea. There have been no reported palpitations, presyncope or syncope.  He does dive and refurbishes houses.     Past Medical History:  Diagnosis Date  . Achilles tendonitis   . Arthritis    lower back  . Diabetes ([place])   . H/O hiatal hernia   . Hypertension   . Lower back pain   . Sprain and strain of calcaneofibular ligament     Past Surgical History:  Procedure Laterality Date  . COLONOSCOPY N/A 07/14/2013   Procedure: COLONOSCOPY;  Surgeon: Garlan Fair, MD;  Location: [facility];  Service: Endoscopy;  Laterality: N/A;     Current Outpatient Prescriptions  Medication Sig Dispense Refill  . allopurinol (ZYLOPRIM) 100 MG tablet Take 1 tablet (100 mg total) by mouth 2 (two) times daily. 180 tablet 3  . aspirin EC 81 MG tablet Take 81 mg by mouth daily.    . colchicine 0.6 MG tablet Take 1 tablet (0.6 mg total) by mouth daily. (Patient taking differently: Take 0.6 mg by mouth as needed. ) 90 tablet 3  . fosinopril (MONOPRIL) 10 MG tablet Take 1 tablet (10 mg total) by mouth daily. 90 tablet 3  . glucose blood (ONETOUCH VERIO) test strip Use as instructed 50 each 11  . metFORMIN (GLUCOPHAGE) 500 MG tablet Take 1 tablet (500 mg total) by mouth 2 (two) times daily with  a meal. 60 tablet 11  . metoprolol succinate (TOPROL-XL) 25 MG 24 hr tablet Take 1 tablet (25 mg total) by mouth daily. 90 tablet 3  . Omega-3 Fatty Acids (FISH OIL CONCENTRATE PO) Take 2 capsules by mouth daily.     [name] DELICA LANCETS FINE MISC 1 Device by Does not apply route daily as needed. 100 each 3   No current facility-administered medications for this visit.     Allergies:   Sulfonamide derivatives    Social History:  The patient  reports that he has quit smoking. His smoking use included Cigarettes. He has quit using smokeless tobacco. He reports that he drinks alcohol. He reports that he does not use drugs.   Family History:  The patient's family history includes Cancer in his brother; Cancer (age of onset: 49) in his brother; Cancer (age of onset: 22) in his father; Heart attack in his maternal uncle; Heart disease in his mother; Stroke in his maternal grandmother.    ROS:  Please see the history of present illness.   Otherwise, review of systems are positive for none.   All other systems are reviewed and negative.    PHYSICAL EXAM: VS:  BP 122/83 (BP Location: Right Arm, Patient Position: Sitting, Cuff Size: Large)   Pulse 82   Ht 6'  1.5" (1.867 m)   Wt 299 lb 12.8 oz (136 kg)   SpO2 97%   BMI 39.02 kg/m  , BMI Body mass index is 39.02 kg/m. GENERAL:  Well appearing HEENT:  Pupils equal round and reactive, fundi not visualized, oral mucosa unremarkable NECK:  No jugular venous distention, waveform within normal limits, carotid upstroke brisk and symmetric, no bruits, no thyromegaly LYMPHATICS:  No cervical, inguinal adenopathy LUNGS:  Clear to auscultation bilaterally BACK:  No CVA tenderness CHEST:  Unremarkable HEART:  PMI not displaced or sustained,S1 and S2 within normal limits, no S3, no S4, no clicks, no rubs, no murmurs ABD:  Flat, positive bowel sounds normal in frequency in pitch, no bruits, no rebound, no guarding, no midline pulsatile mass, no  hepatomegaly, no splenomegaly EXT:  2 plus pulses throughout, no edema, no cyanosis no clubbing SKIN:  No rashes no nodules NEURO:  Cranial nerves II through XII grossly intact, motor grossly intact throughout PSYCH:  Cognitively intact, oriented to person place and time    EKG:  EKG is not ordered today. The ekg ordered 02/15/16 demonstrates NSR, rate 78, RBBB, no acute ST T wave changes.     Recent Labs: 02/16/2016: ALT 23; BUN 21; Creatinine, Ser 0.79; Hemoglobin 16.6; Platelets 246.0; Potassium 5.0; Sodium 136; TSH 1.46    Lipid Panel    Component Value Date/Time   CHOL 306 (H) 02/16/2016 0907   TRIG 283.0 (H) 02/16/2016 0907   HDL 46.10 02/16/2016 0907   CHOLHDL 7 02/16/2016 0907   VLDL 56.6 (H) 02/16/2016 0907   LDLDIRECT 221.0 02/16/2016 0907      Wt Readings from Last 3 Encounters:  02/20/16 299 lb 12.8 oz (136 kg)  02/15/16 297 lb (134.7 kg)  02/07/16 (!) 303 lb 11.2 oz (137.8 kg)      Other studies Reviewed: Additional studies/ records that were reviewed today include: Labs, EKG. Review of the above records demonstrates:  Please see elsewhere in the note.     ASSESSMENT AND PLAN:  RBBB:  I don't suspect structural heart disease.  However, with his affinity for diving I want to make sure he has a structurally normal heart including right heart pressures.  I will obtain an echo.  RISK REDUCTION:  I would like to screen with a coronary calcium score.  OBESITY:  The patient understands the need to lose weight with diet and exercise. We have discussed specific strategies for this.    DM:  We discussed this in the context of talking about diet and activity.  DYSLIPIDEMIA:  Lipid profile needs to be checked again in 8 weeks after his sugar has been treated.  He should have a direct LDL.  I would suggest a low threshold for statin.   HTN:  The blood pressure is at target. No change in medications is indicated. We will continue with therapeutic lifestyle changes  (TLC).  Current medicines are reviewed at length with the patient today.  The patient does not have concerns regarding medicines.  The following changes have been made:  no change  Labs/ tests ordered today include:   Orders Placed This Encounter  Procedures  . CT CARDIAC SCORING  . ECHOCARDIOGRAM COMPLETE     Disposition:   FU with me as needed or based on the results of the above.     Signed, Minus Breeding, MD  02/22/2016 4:22 PM    [facility]

## 2016-02-22 ENCOUNTER — Encounter: Payer: Self-pay | Admitting: Cardiology

## 2016-03-15 ENCOUNTER — Other Ambulatory Visit: Payer: Self-pay

## 2016-03-15 ENCOUNTER — Ambulatory Visit (HOSPITAL_COMMUNITY): Payer: PRIVATE HEALTH INSURANCE | Attending: Cardiology

## 2016-03-15 ENCOUNTER — Ambulatory Visit (INDEPENDENT_AMBULATORY_CARE_PROVIDER_SITE_OTHER)
Admission: RE | Admit: 2016-03-15 | Discharge: 2016-03-15 | Disposition: A | Discharge status: Home / Self Care | Payer: Self-pay | Source: Ambulatory Visit | Attending: Cardiology | Admitting: Cardiology

## 2016-03-15 ENCOUNTER — Encounter: Payer: Self-pay | Admitting: Cardiology

## 2016-03-15 DIAGNOSIS — R0602 Shortness of breath: Secondary | ICD-10-CM

## 2016-03-15 DIAGNOSIS — I119 Hypertensive heart disease without heart failure: Secondary | ICD-10-CM | POA: Insufficient documentation

## 2016-03-15 DIAGNOSIS — R9431 Abnormal electrocardiogram [ECG] [EKG]: Secondary | ICD-10-CM | POA: Diagnosis present

## 2016-03-15 DIAGNOSIS — E119 Type 2 diabetes mellitus without complications: Secondary | ICD-10-CM | POA: Insufficient documentation

## 2016-03-16 ENCOUNTER — Other Ambulatory Visit: Payer: Self-pay

## 2016-03-16 ENCOUNTER — Encounter: Payer: Self-pay | Admitting: Internal Medicine

## 2016-03-16 ENCOUNTER — Ambulatory Visit (INDEPENDENT_AMBULATORY_CARE_PROVIDER_SITE_OTHER): Payer: PRIVATE HEALTH INSURANCE | Admitting: Internal Medicine

## 2016-03-16 ENCOUNTER — Other Ambulatory Visit (INDEPENDENT_AMBULATORY_CARE_PROVIDER_SITE_OTHER): Payer: PRIVATE HEALTH INSURANCE

## 2016-03-16 DIAGNOSIS — IMO0001 Reserved for inherently not codable concepts without codable children: Secondary | ICD-10-CM

## 2016-03-16 DIAGNOSIS — R972 Elevated prostate specific antigen [PSA]: Secondary | ICD-10-CM | POA: Diagnosis not present

## 2016-03-16 DIAGNOSIS — E1165 Type 2 diabetes mellitus with hyperglycemia: Secondary | ICD-10-CM

## 2016-03-16 DIAGNOSIS — I10 Essential (primary) hypertension: Secondary | ICD-10-CM

## 2016-03-16 DIAGNOSIS — R739 Hyperglycemia, unspecified: Secondary | ICD-10-CM

## 2016-03-16 DIAGNOSIS — E785 Hyperlipidemia, unspecified: Secondary | ICD-10-CM

## 2016-03-16 LAB — LIPID PANEL
CHOL/HDL RATIO: 6
Cholesterol: 229 mg/dL — ABNORMAL HIGH (ref 0–200)
HDL: 38.4 mg/dL — ABNORMAL LOW (ref >39.00)
NONHDL: 190.37
TRIGLYCERIDES: 277 mg/dL — AB (ref 0.0–149.0)
VLDL: 55.4 mg/dL — ABNORMAL HIGH (ref 0.0–40.0)

## 2016-03-16 LAB — HEPATIC FUNCTION PANEL
ALBUMIN: 4.2 g/dL (ref 3.5–5.2)
ALK PHOS: 60 U/L (ref 39–117)
ALT: 22 U/L (ref 0–53)
AST: 12 U/L (ref 0–37)
BILIRUBIN DIRECT: 0.1 mg/dL (ref 0.0–0.3)
BILIRUBIN TOTAL: 0.4 mg/dL (ref 0.2–1.2)
Total Protein: 7.3 g/dL (ref 6.0–8.3)

## 2016-03-16 LAB — BASIC METABOLIC PANEL
BUN: 24 mg/dL — AB (ref 6–23)
CALCIUM: 9.7 mg/dL (ref 8.4–10.5)
CHLORIDE: 105 meq/L (ref 96–112)
CO2: 26 meq/L (ref 19–32)
CREATININE: 0.85 mg/dL (ref 0.40–1.50)
GFR: 98.67 mL/min (ref >60.00)
Glucose, Bld: 171 mg/dL — ABNORMAL HIGH (ref 70–99)
Potassium: 4.7 mEq/L (ref 3.5–5.1)
Sodium: 139 mEq/L (ref 135–145)

## 2016-03-16 LAB — LDL CHOLESTEROL, DIRECT: Direct LDL: 153 mg/dL

## 2016-03-16 LAB — HEMOGLOBIN A1C: Hgb A1c MFr Bld: 9.4 % — ABNORMAL HIGH (ref 4.6–6.5)

## 2016-03-16 MED ORDER — EMPAGLIFLOZIN-METFORMIN HCL ER 5-1000 MG PO TB24: 1.0000 | ORAL_TABLET | Freq: Two times a day (BID) | ORAL | 3 refills | Status: DC

## 2016-03-16 MED ORDER — GLUCOSE BLOOD VI STRP: ORAL_STRIP | 5 refills | Status: DC

## 2016-03-16 MED ORDER — ONETOUCH DELICA LANCETS FINE MISC: 1.0000 | Freq: Every day | 3 refills | Status: DC | PRN

## 2016-03-16 NOTE — Progress Notes (Signed)
Pt requested colchicine to sig to be changed. Informed pt that it is a PRN med and I have removed from his list.

## 2016-03-16 NOTE — Progress Notes (Signed)
Subjective:  Patient ID: [name], male    DOB: 1960-10-31  Age: 55 y.o. MRN: [id number]  CC: No chief complaint on file.   HPI Math Finck presents for DM2, dyslipidemia, elevated PSA f/u  Outpatient Medications Prior to Visit  Medication Sig Dispense Refill  . allopurinol (ZYLOPRIM) 100 MG tablet Take 1 tablet (100 mg total) by mouth 2 (two) times daily. 180 tablet 3  . aspirin EC 81 MG tablet Take 81 mg by mouth daily.    . fosinopril (MONOPRIL) 10 MG tablet Take 1 tablet (10 mg total) by mouth daily. 90 tablet 3  . glucose blood (ONETOUCH VERIO) test strip Use as instructed 50 each 11  . metFORMIN (GLUCOPHAGE) 500 MG tablet Take 1 tablet (500 mg total) by mouth 2 (two) times daily with a meal. 60 tablet 11  . metoprolol succinate (TOPROL-XL) 25 MG 24 hr tablet Take 1 tablet (25 mg total) by mouth daily. 90 tablet 3  . Omega-3 Fatty Acids (FISH OIL CONCENTRATE PO) Take 2 capsules by mouth daily.     [name] DELICA LANCETS FINE MISC 1 Device by Does not apply route daily as needed. 100 each 3  . colchicine 0.6 MG tablet Take 1 tablet (0.6 mg total) by mouth daily. (Patient taking differently: Take 0.6 mg by mouth as needed. ) 90 tablet 3   No facility-administered medications prior to visit.     ROS Review of Systems  Constitutional: Negative for appetite change, fatigue and unexpected weight change.  HENT: Negative for congestion, nosebleeds, sneezing, sore throat and trouble swallowing.   Eyes: Negative for itching and visual disturbance.  Respiratory: Negative for cough.   Cardiovascular: Negative for chest pain, palpitations and leg swelling.  Gastrointestinal: Negative for abdominal distention, blood in stool, diarrhea and nausea.  Genitourinary: Negative for frequency and hematuria.  Musculoskeletal: Negative for back pain, gait problem, joint swelling and neck pain.  Skin: Negative for rash.  Neurological: Negative for dizziness, tremors, speech difficulty and  weakness.  Psychiatric/Behavioral: Negative for agitation, dysphoric mood and sleep disturbance. The patient is not nervous/anxious.     Objective:  BP 120/82   Pulse 79   Wt 298 lb (135.2 kg)   SpO2 96%   BMI 38.78 kg/m   BP Readings from Last 3 Encounters:  03/16/16 120/82  02/20/16 122/83  02/17/16 126/88    Wt Readings from Last 3 Encounters:  03/16/16 298 lb (135.2 kg)  02/20/16 299 lb 12.8 oz (136 kg)  02/15/16 297 lb (134.7 kg)    Physical Exam  Constitutional: He is oriented to person, place, and time. He appears well-developed. No distress.  NAD  HENT:  Mouth/Throat: Oropharynx is clear and moist.  Eyes: Conjunctivae are normal. Pupils are equal, round, and reactive to light.  Neck: Normal range of motion. No JVD present. No thyromegaly present.  Cardiovascular: Normal rate, regular rhythm, normal heart sounds and intact distal pulses.  Exam reveals no gallop and no friction rub.   No murmur heard. Pulmonary/Chest: Effort normal and breath sounds normal. No respiratory distress. He has no wheezes. He has no rales. He exhibits no tenderness.  Abdominal: Soft. Bowel sounds are normal. He exhibits no distension and no mass. There is no tenderness. There is no rebound and no guarding.  Musculoskeletal: Normal range of motion. He exhibits no edema or tenderness.  Lymphadenopathy:    He has no cervical adenopathy.  Neurological: He is alert and oriented to person, place, and time. He has  normal reflexes. No cranial nerve deficit. He exhibits normal muscle tone. He displays a negative Romberg sign. Coordination and gait normal.  Skin: Skin is warm and dry. No rash noted.  Psychiatric: He has a normal mood and affect. His behavior is normal. Judgment and thought content normal.  Obese  Lab Results  Component Value Date   WBC 6.3 02/16/2016   HGB 16.6 02/16/2016   HCT 48.4 02/16/2016   PLT 246.0 02/16/2016   GLUCOSE 171 (H) 03/16/2016   CHOL 229 (H) 03/16/2016    TRIG 277.0 (H) 03/16/2016   HDL 38.40 (L) 03/16/2016   LDLDIRECT 153.0 03/16/2016   ALT 22 03/16/2016   AST 12 03/16/2016   NA 139 03/16/2016   K 4.7 03/16/2016   CL 105 03/16/2016   CREATININE 0.85 03/16/2016   BUN 24 (H) 03/16/2016   CO2 26 03/16/2016   TSH 1.46 02/16/2016   PSA 4.07 (H) 02/16/2016   INR 1.1 (H) 10/29/2014   HGBA1C 9.4 (H) 03/16/2016    Ct Cardiac Scoring  Addendum Date: 03/15/2016   ADDENDUM REPORT: 03/15/2016 17:07 CLINICAL DATA:  Risk stratification EXAM: Coronary Calcium Score TECHNIQUE: The patient was scanned on a Siemens Somatom 64 slice scanner. Axial non-contrast 1mm slices were carried out through the heart. The data set was analyzed on a dedicated work station and scored using the [place]. FINDINGS: Non-cardiac: No significant non cardiac findings on limited lung and soft tissue windows. See separate report from [facility]. Ascending Aorta:  3.4 cm Pericardium: Normal Coronary arteries: Calcium seen in proximal circumflex and more dense in proximal and mid LAD IMPRESSION: Coronary calcium score of 218. This was 79th percentile for age and sex matched control. Jenkins Rouge Electronically Signed   By: Jenkins Rouge M.D.   On: 03/15/2016 17:07   Result Date: 03/15/2016 CLINICAL DATA:  Shortness of breath.  Right bundle branch block EXAM: OVER-READ INTERPRETATION  CT CHEST The following report is an over-read performed by radiologist Dr. Collene Leyden [facility] on 03/15/2016. This over-read does not include interpretation of cardiac or coronary anatomy or pathology. The calcium score interpretation by the cardiologist is attached. COMPARISON:  None. FINDINGS: Cardiovascular: Heart is normal size. Visualized aorta is normal caliber. Mediastinum/Nodes: No adenopathy in the visualized lower mediastinum or hila. Lungs/Pleura: Visualized lungs are clear. Triangular density noted in the right lung along the minor fissure, likely area of  scarring. Subpleural density anteriorly in the right middle lobe measures 5 mm. No effusions. Upper Abdomen: Imaging into the upper abdomen shows no acute findings. Musculoskeletal: Chest wall soft tissues are unremarkable. No acute bony abnormality or focal bone lesion. IMPRESSION: Triangular density along the right minor fissure likely reflects scarring. 5 mm subpleural density in the right middle lobe. No follow-up needed if patient is low-risk. Non-contrast chest CT can be considered in 12 months if patient is high-risk. This recommendation follows the consensus statement: Guidelines for Management of Incidental Pulmonary Nodules Detected on CT Images: From the Fleischner Society 2017; Radiology 2017; 284:228-243. Electronically Signed: By: Rolm Baptise M.D. On: 03/15/2016 15:19    Assessment & Plan:   There are no diagnoses linked to this encounter. I am having Mr. Dant maintain his Omega-3 Fatty Acids (FISH OIL CONCENTRATE PO), aspirin EC, allopurinol, metoprolol succinate, fosinopril, metFORMIN, glucose blood, and ONETOUCH DELICA LANCETS FINE.  No orders of the defined types were placed in this encounter.    Follow-up: No Follow-up on file.  Walker Kehr, MD

## 2016-03-16 NOTE — Assessment & Plan Note (Signed)
Labs

## 2016-03-16 NOTE — Progress Notes (Signed)
Pre visit review using our clinic review tool, if applicable. No additional management support is needed unless otherwise documented below in the visit note. 

## 2016-03-16 NOTE — Patient Instructions (Addendum)
Stop Metformin. Start Synjardy    Hemoglobin A1c Test Some of the sugar (glucose) that circulates in your blood sticks or binds to blood proteins. Hemoglobin (Hb or Hgb) is one type of blood protein that glucose binds to. It also carries oxygen in the red blood cells (RBCs). When glucose binds to Hb, the glucose-coated Hb is called glycated Hb. Once Hb is glycated, it remains that way for the life of the RBC. This is about 120 days. Rather than testing your blood glucose level on one single day, the hemoglobin A1c (HbA1c) test measures the average amount of glycated hemoglobin and, therefore, the average amount of glucose in your blood during the 3-4 months just before the test is done. The HbA1c test is used to monitor long-term control of blood sugar in people who have diabetes mellitus. The HbA1c test can also be used in addition to or in combination with fasting blood glucose level and oral glucose tolerance tests. What do the results mean?   It is your responsibility to obtain your test results. Ask the lab or department performing the test when and how you will get your results. Contact your health care provider to discuss any questions you have about your results. Range of Normal Values  Ranges for normal values may vary among different labs and hospitals. You should always check with your health care provider after having lab work or other tests done to discuss the meaning of your test results and whether your values are considered within normal limits. The ranges for normal HbA1c test results are as follows:  Adult or child without diabetes: 4-5.7%.  Adult or child with diabetes and good blood glucose control: less than 7%. Several factors can affect HbA1c test results. These may include:  Diseases (hemoglobinopathies) that cause a change in the shape, size, or amount of Hb in your blood.  Longer than normal RBC life span.  Abnormally low levels of certain proteins in your  blood.  Eating foods or taking supplements that are high in vitamin C (ascorbic acid). Meaning of Results Outside Normal Value Ranges  Abnormally high HbA1c values are most commonly an indication of prediabetes mellitus and diabetes mellitus:  An HbA1c result of 5.7-6.4% is considered diagnostic of prediabetes mellitus.  An HbA1c result of 6.5% or higher on two separate occasions is considered diagnostic of diabetes mellitus. Abnormally low HbA1c values can be caused by several health conditions. These may include:  Pregnancy.  A large amount of blood loss.  Blood transfusions.  Low red blood cell count (anemia). This is caused by premature destruction of red blood cells.  Long-term kidney failure.  Some unusual forms of Hb (Hb variants), such as sickle cell trait. Discuss your test results with your health care provider. He or she will use the results to make a diagnosis and determine a treatment plan that is right for you. Talk with your health care provider to discuss your results, treatment options, and if necessary, the need for more tests. Talk with your health care provider if you have any questions about your results. This information is not intended to replace advice given to you by your health care provider. Make sure you discuss any questions you have with your health care provider. Document Released: 04/17/2004 Document Revised: 12/21/2015 Document Reviewed: 08/10/2013 Elsevier Interactive Patient Education  2017 [employer].

## 2016-03-19 ENCOUNTER — Telehealth: Payer: Self-pay | Admitting: *Deleted

## 2016-03-19 DIAGNOSIS — R931 Abnormal findings on diagnostic imaging of heart and coronary circulation: Secondary | ICD-10-CM

## 2016-03-19 LAB — PSA, TOTAL AND FREE
PSA, % Free: 17 % — ABNORMAL LOW (ref >25)
PSA, FREE: 0.4 ng/mL
PSA, Total: 2.3 ng/mL (ref <=4.0)

## 2016-03-19 MED ORDER — ATORVASTATIN CALCIUM 40 MG PO TABS: 40.0000 mg | ORAL_TABLET | Freq: Every day | ORAL | 3 refills | Status: DC

## 2016-03-19 NOTE — Assessment & Plan Note (Signed)
Monopril and Toprol Synjardy should help as well

## 2016-03-19 NOTE — Assessment & Plan Note (Signed)
Free PSA is pending May need a urology referral

## 2016-03-19 NOTE — Telephone Encounter (Signed)
Spoke with pt, appt cancel for tomorrow, Atorvastatin 40 mg was send into pt pharmacy for 1 year, GXT ordered and send to scheduler to be schedule

## 2016-03-19 NOTE — Assessment & Plan Note (Signed)
Will treat DM and monitor labs Wt loss

## 2016-03-19 NOTE — Telephone Encounter (Signed)
-----   Message from Minus Breeding, MD sent at 03/19/2016  7:34 AM EST ----- Schedule POET ([employer]).  Cancel appt for 7:30 tomorrow morning.  Patient also needs Lipitor 40 mg daily PO.  Disp number 90 with 3 refills.  Need to call him to make sure we have the correct pharamcy.  Call Mr. Scullin with the results and send results to Walker Kehr, MD

## 2016-03-20 ENCOUNTER — Telehealth (HOSPITAL_COMMUNITY): Payer: Self-pay

## 2016-03-20 ENCOUNTER — Ambulatory Visit: Payer: PRIVATE HEALTH INSURANCE | Admitting: Cardiology

## 2016-03-20 NOTE — Telephone Encounter (Signed)
Encounter complete. 

## 2016-03-21 ENCOUNTER — Telehealth: Payer: Self-pay | Admitting: *Deleted

## 2016-03-21 ENCOUNTER — Ambulatory Visit (HOSPITAL_COMMUNITY)
Admission: RE | Admit: 2016-03-21 | Discharge: 2016-03-21 | Disposition: A | Discharge status: Home / Self Care | Payer: PRIVATE HEALTH INSURANCE | Source: Ambulatory Visit | Attending: Cardiovascular Disease | Admitting: Cardiovascular Disease

## 2016-03-21 ENCOUNTER — Other Ambulatory Visit: Payer: Self-pay | Admitting: Internal Medicine

## 2016-03-21 DIAGNOSIS — R931 Abnormal findings on diagnostic imaging of heart and coronary circulation: Secondary | ICD-10-CM | POA: Diagnosis present

## 2016-03-21 DIAGNOSIS — I4510 Unspecified right bundle-branch block: Secondary | ICD-10-CM | POA: Diagnosis not present

## 2016-03-21 DIAGNOSIS — R972 Elevated prostate specific antigen [PSA]: Secondary | ICD-10-CM

## 2016-03-21 LAB — EXERCISE TOLERANCE TEST
CHL CUP MPHR: 163 {beats}/min
CHL CUP RESTING HR STRESS: 68 {beats}/min
CHL RATE OF PERCEIVED EXERTION: 17
CSEPEDS: 20 s
CSEPPHR: 150 {beats}/min
Estimated workload: 7.4 METS
Exercise duration (min): 6 min
Percent HR: 92 %

## 2016-03-21 MED ORDER — EMPAGLIFLOZIN-METFORMIN HCL ER 5-1000 MG PO TB24: 1.0000 | ORAL_TABLET | Freq: Two times a day (BID) | ORAL | 3 refills | Status: DC

## 2016-03-21 NOTE — Telephone Encounter (Signed)
Pt left msg on triage stating [facility] never received the rx for synjardy. Called pt back no answer LMOM per chart MD printed script, but will fax to [facility]...[name]

## 2016-03-23 ENCOUNTER — Telehealth: Payer: Self-pay | Admitting: *Deleted

## 2016-03-23 NOTE — Telephone Encounter (Signed)
We sent [name] to wrong [facility].  [name] is not preferred and costs too much.  [name] can get Xigduo XR for pt for free with a coupon. Ok to send new Rx?

## 2016-03-25 NOTE — Telephone Encounter (Signed)
OK - pls print a Rx and give a card  Thx

## 2016-03-26 ENCOUNTER — Telehealth: Payer: Self-pay | Admitting: *Deleted

## 2016-03-26 DIAGNOSIS — E785 Hyperlipidemia, unspecified: Secondary | ICD-10-CM

## 2016-03-26 DIAGNOSIS — Z79899 Other long term (current) drug therapy: Secondary | ICD-10-CM

## 2016-03-26 MED ORDER — DAPAGLIFLOZIN PRO-METFORMIN ER 5-1000 MG PO TB24: 1.0000 | ORAL_TABLET | Freq: Two times a day (BID) | ORAL | 11 refills | Status: DC

## 2016-03-26 NOTE — Telephone Encounter (Signed)
-----   Message from Minus Breeding, MD sent at 03/23/2016  5:16 PM EST ----- No evidence of ischemia.  He needs aggressive risk reduction.  He needs to have lipids drawn in 10 weeks with liver profile.  He needs to have a follow up with me in six months.  I discussed the results with him.  Send to Walker Kehr, MD

## 2016-03-26 NOTE — Telephone Encounter (Signed)
Lab ordered and mail to pt

## 2016-03-26 NOTE — Telephone Encounter (Signed)
Rec'd another called from [facility] requesting prescription for the Xigduo to be sent to the pharmacy. Sent rx electronically...[name]

## 2016-08-08 ENCOUNTER — Ambulatory Visit (INDEPENDENT_AMBULATORY_CARE_PROVIDER_SITE_OTHER): Payer: PRIVATE HEALTH INSURANCE | Admitting: Internal Medicine

## 2016-08-08 ENCOUNTER — Other Ambulatory Visit (INDEPENDENT_AMBULATORY_CARE_PROVIDER_SITE_OTHER): Payer: PRIVATE HEALTH INSURANCE

## 2016-08-08 ENCOUNTER — Encounter: Payer: Self-pay | Admitting: Internal Medicine

## 2016-08-08 DIAGNOSIS — I82401 Acute embolism and thrombosis of unspecified deep veins of right lower extremity: Secondary | ICD-10-CM | POA: Diagnosis not present

## 2016-08-08 DIAGNOSIS — R972 Elevated prostate specific antigen [PSA]: Secondary | ICD-10-CM

## 2016-08-08 DIAGNOSIS — M1000 Idiopathic gout, unspecified site: Secondary | ICD-10-CM

## 2016-08-08 DIAGNOSIS — E1165 Type 2 diabetes mellitus with hyperglycemia: Secondary | ICD-10-CM

## 2016-08-08 DIAGNOSIS — E785 Hyperlipidemia, unspecified: Secondary | ICD-10-CM

## 2016-08-08 DIAGNOSIS — R739 Hyperglycemia, unspecified: Secondary | ICD-10-CM

## 2016-08-08 DIAGNOSIS — I10 Essential (primary) hypertension: Secondary | ICD-10-CM | POA: Diagnosis not present

## 2016-08-08 DIAGNOSIS — R7989 Other specified abnormal findings of blood chemistry: Secondary | ICD-10-CM | POA: Diagnosis not present

## 2016-08-08 DIAGNOSIS — IMO0001 Reserved for inherently not codable concepts without codable children: Secondary | ICD-10-CM

## 2016-08-08 LAB — BASIC METABOLIC PANEL
BUN: 22 mg/dL (ref 6–23)
CHLORIDE: 104 meq/L (ref 96–112)
CO2: 26 meq/L (ref 19–32)
CREATININE: 0.82 mg/dL (ref 0.40–1.50)
Calcium: 10 mg/dL (ref 8.4–10.5)
GFR: 102.7 mL/min (ref >60.00)
Glucose, Bld: 139 mg/dL — ABNORMAL HIGH (ref 70–99)
POTASSIUM: 5.3 meq/L — AB (ref 3.5–5.1)
Sodium: 139 mEq/L (ref 135–145)

## 2016-08-08 LAB — HEPATIC FUNCTION PANEL
ALT: 30 U/L (ref 0–53)
AST: 17 U/L (ref 0–37)
Albumin: 4.4 g/dL (ref 3.5–5.2)
Alkaline Phosphatase: 73 U/L (ref 39–117)
BILIRUBIN DIRECT: 0.1 mg/dL (ref 0.0–0.3)
BILIRUBIN TOTAL: 0.7 mg/dL (ref 0.2–1.2)
TOTAL PROTEIN: 7.2 g/dL (ref 6.0–8.3)

## 2016-08-08 LAB — LIPID PANEL
CHOL/HDL RATIO: 3
Cholesterol: 197 mg/dL (ref 0–200)
HDL: 61 mg/dL (ref >39.00)
NONHDL: 135.73
Triglycerides: 229 mg/dL — ABNORMAL HIGH (ref 0.0–149.0)
VLDL: 45.8 mg/dL — AB (ref 0.0–40.0)

## 2016-08-08 LAB — PSA: PSA: 2.99 ng/mL (ref 0.10–4.00)

## 2016-08-08 LAB — LDL CHOLESTEROL, DIRECT: LDL DIRECT: 96 mg/dL

## 2016-08-08 LAB — HEMOGLOBIN A1C: HEMOGLOBIN A1C: 7.2 % — AB (ref 4.6–6.5)

## 2016-08-08 MED ORDER — ATORVASTATIN CALCIUM 40 MG PO TABS: 40.0000 mg | ORAL_TABLET | Freq: Every day | ORAL | 3 refills | Status: DC

## 2016-08-08 MED ORDER — FOSINOPRIL SODIUM 10 MG PO TABS: 10.0000 mg | ORAL_TABLET | Freq: Every day | ORAL | 3 refills | Status: DC

## 2016-08-08 MED ORDER — ALLOPURINOL 100 MG PO TABS: 100.0000 mg | ORAL_TABLET | Freq: Two times a day (BID) | ORAL | 3 refills | Status: DC

## 2016-08-08 MED ORDER — ACCU-CHEK MULTICLIX LANCETS MISC: 11 refills | Status: AC

## 2016-08-08 MED ORDER — GLUCOSE BLOOD VI STRP: ORAL_STRIP | 12 refills | Status: AC

## 2016-08-08 MED ORDER — METOPROLOL SUCCINATE ER 25 MG PO TB24: 25.0000 mg | ORAL_TABLET | Freq: Every day | ORAL | 3 refills | Status: DC

## 2016-08-08 MED ORDER — DAPAGLIFLOZIN PRO-METFORMIN ER 5-1000 MG PO TB24: 1.0000 | ORAL_TABLET | Freq: Two times a day (BID) | ORAL | 3 refills | Status: DC

## 2016-08-08 MED FILL — ACCU-CHEK GUIDE TEST STRIP: 30 days supply | Qty: 100 | Fill #0

## 2016-08-08 NOTE — Assessment & Plan Note (Signed)
Dyslipidemia

## 2016-08-08 NOTE — Assessment & Plan Note (Signed)
Xigduo Labs 

## 2016-08-08 NOTE — Patient Instructions (Signed)
Shingrix

## 2016-08-08 NOTE — Assessment & Plan Note (Signed)
use Xarelto prn for travel.

## 2016-08-08 NOTE — Assessment & Plan Note (Signed)
Toprol, Monopril 

## 2016-08-08 NOTE — Assessment & Plan Note (Signed)
PSA today

## 2016-08-08 NOTE — Progress Notes (Signed)
Subjective:  Patient ID: [name], male    DOB: [birth year]  Age: 56 y.o. MRN: [id number]  CC: No chief complaint on file.   HPI [name] presents for DM, HTN, obesity f/u  Outpatient Medications Prior to Visit  Medication Sig Dispense Refill  . allopurinol (ZYLOPRIM) 100 MG tablet Take 1 tablet (100 mg total) by mouth 2 (two) times daily. 180 tablet 3  . aspirin EC 81 MG tablet Take 81 mg by mouth daily.    . Dapagliflozin-Metformin HCl ER (XIGDUO XR) 08-998 MG TB24 Take 1 tablet by mouth 2 (two) times daily. 60 tablet 11  . fosinopril (MONOPRIL) 10 MG tablet Take 1 tablet (10 mg total) by mouth daily. 90 tablet 3  . metoprolol succinate (TOPROL-XL) 25 MG 24 hr tablet Take 1 tablet (25 mg total) by mouth daily. 90 tablet 3  . Omega-3 Fatty Acids (FISH OIL CONCENTRATE PO) Take 2 capsules by mouth daily.     Marland Kitchen glucose blood (ONETOUCH VERIO) test strip Use as instructed 150 each 5  . ONETOUCH DELICA LANCETS FINE MISC 1 Device by Does not apply route daily as needed. 150 each 3  . atorvastatin (LIPITOR) 40 MG tablet Take 1 tablet (40 mg total) by mouth daily. 90 tablet 3  . Empagliflozin-Metformin HCl ER (SYNJARDY XR) 08-998 MG TB24 Take 1 tablet by mouth 2 (two) times daily. 180 tablet 3   No facility-administered medications prior to visit.     ROS Review of Systems  Constitutional: Negative for appetite change, fatigue and unexpected weight change.  HENT: Negative for congestion, nosebleeds, sneezing, sore throat and trouble swallowing.   Eyes: Negative for itching and visual disturbance.  Respiratory: Negative for cough.   Cardiovascular: Negative for chest pain, palpitations and leg swelling.  Gastrointestinal: Negative for abdominal distention, blood in stool, diarrhea and nausea.  Genitourinary: Negative for frequency and hematuria.  Musculoskeletal: Negative for back pain, gait problem, joint swelling and neck pain.  Skin: Negative for rash.  Neurological: Negative  for dizziness, tremors, speech difficulty and weakness.  Psychiatric/Behavioral: Negative for agitation, dysphoric mood and sleep disturbance. The patient is not nervous/anxious.     Objective:  Ht 6' 1.5" (1.867 m)   Wt 299 lb 0.6 oz (135.6 kg)   BMI 38.92 kg/m   BP Readings from Last 3 Encounters:  03/16/16 120/82  02/20/16 122/83  02/17/16 126/88    Wt Readings from Last 3 Encounters:  08/08/16 299 lb 0.6 oz (135.6 kg)  03/16/16 298 lb (135.2 kg)  02/20/16 299 lb 12.8 oz (136 kg)    Physical Exam  Constitutional: He is oriented to person, place, and time. He appears well-developed. No distress.  NAD  HENT:  Mouth/Throat: Oropharynx is clear and moist.  Eyes: Conjunctivae are normal. Pupils are equal, round, and reactive to light.  Neck: Normal range of motion. No JVD present. No thyromegaly present.  Cardiovascular: Normal rate, regular rhythm, normal heart sounds and intact distal pulses.  Exam reveals no gallop and no friction rub.   No murmur heard. Pulmonary/Chest: Effort normal and breath sounds normal. No respiratory distress. He has no wheezes. He has no rales. He exhibits no tenderness.  Abdominal: Soft. Bowel sounds are normal. He exhibits no distension and no mass. There is no tenderness. There is no rebound and no guarding.  Musculoskeletal: Normal range of motion. He exhibits no edema or tenderness.  Lymphadenopathy:    He has no cervical adenopathy.  Neurological: He is alert and oriented  to person, place, and time. He has normal reflexes. No cranial nerve deficit. He exhibits normal muscle tone. He displays a negative Romberg sign. Coordination and gait normal.  Skin: Skin is warm and dry. No rash noted.  Psychiatric: He has a normal mood and affect. His behavior is normal. Judgment and thought content normal.  Obese  Lab Results  Component Value Date   WBC 6.3 02/16/2016   HGB 16.6 02/16/2016   HCT 48.4 02/16/2016   PLT 246.0 02/16/2016   GLUCOSE 171  (H) 03/16/2016   CHOL 229 (H) 03/16/2016   TRIG 277.0 (H) 03/16/2016   HDL 38.40 (L) 03/16/2016   LDLDIRECT 153.0 03/16/2016   ALT 22 03/16/2016   AST 12 03/16/2016   NA 139 03/16/2016   K 4.7 03/16/2016   CL 105 03/16/2016   CREATININE 0.85 03/16/2016   BUN 24 (H) 03/16/2016   CO2 26 03/16/2016   TSH 1.46 02/16/2016   PSA 4.07 (H) 02/16/2016   INR 1.1 (H) 10/29/2014   HGBA1C 9.4 (H) 03/16/2016    No results found.  Assessment & Plan:   There are no diagnoses linked to this encounter. I have discontinued Mr. Dustan [facility] LANCETS FINE, glucose blood, and Empagliflozin-Metformin HCl ER. I am also having him maintain his Omega-3 Fatty Acids (FISH OIL CONCENTRATE PO), aspirin EC, allopurinol, metoprolol succinate, fosinopril, atorvastatin, and Dapagliflozin-Metformin HCl ER.  No orders of the defined types were placed in this encounter.    Follow-up: No Follow-up on file.  Walker Kehr, MD

## 2016-08-08 NOTE — Assessment & Plan Note (Signed)
No relapse 

## 2016-08-08 NOTE — Progress Notes (Signed)
Pre visit review using our clinic review tool, if applicable. No additional management support is needed unless otherwise documented below in the visit note. 

## 2016-08-09 MED FILL — ACCU-CHEK FASTCLIX LANCETS: 30 days supply | Qty: 102 | Fill #0

## 2016-08-15 ENCOUNTER — Ambulatory Visit: Payer: PRIVATE HEALTH INSURANCE | Admitting: Internal Medicine

## 2016-10-12 ENCOUNTER — Other Ambulatory Visit: Payer: Self-pay | Admitting: Orthopedic Surgery

## 2016-10-12 DIAGNOSIS — M25512 Pain in left shoulder: Secondary | ICD-10-CM

## 2016-10-29 ENCOUNTER — Ambulatory Visit
Admission: RE | Admit: 2016-10-29 | Discharge: 2016-10-29 | Disposition: A | Discharge status: Home / Self Care | Payer: PRIVATE HEALTH INSURANCE | Source: Ambulatory Visit | Attending: Orthopedic Surgery | Admitting: Orthopedic Surgery

## 2016-10-29 DIAGNOSIS — M25512 Pain in left shoulder: Secondary | ICD-10-CM

## 2016-11-28 LAB — HM DIABETES EYE EXAM

## 2016-12-19 ENCOUNTER — Ambulatory Visit (INDEPENDENT_AMBULATORY_CARE_PROVIDER_SITE_OTHER): Payer: PRIVATE HEALTH INSURANCE | Admitting: Internal Medicine

## 2016-12-19 ENCOUNTER — Encounter: Payer: Self-pay | Admitting: Internal Medicine

## 2016-12-19 VITALS — BP 122/78 | HR 70 | Temp 98.2°F | Ht 73.5 in | Wt 288.0 lb

## 2016-12-19 DIAGNOSIS — R972 Elevated prostate specific antigen [PSA]: Secondary | ICD-10-CM | POA: Diagnosis not present

## 2016-12-19 DIAGNOSIS — E1165 Type 2 diabetes mellitus with hyperglycemia: Secondary | ICD-10-CM

## 2016-12-19 DIAGNOSIS — Z23 Encounter for immunization: Secondary | ICD-10-CM

## 2016-12-19 DIAGNOSIS — I10 Essential (primary) hypertension: Secondary | ICD-10-CM

## 2016-12-19 DIAGNOSIS — I82401 Acute embolism and thrombosis of unspecified deep veins of right lower extremity: Secondary | ICD-10-CM | POA: Diagnosis not present

## 2016-12-19 DIAGNOSIS — M1000 Idiopathic gout, unspecified site: Secondary | ICD-10-CM | POA: Diagnosis not present

## 2016-12-19 DIAGNOSIS — IMO0001 Reserved for inherently not codable concepts without codable children: Secondary | ICD-10-CM

## 2016-12-19 NOTE — Assessment & Plan Note (Signed)
PSA

## 2016-12-19 NOTE — Assessment & Plan Note (Signed)
Toprol, Monopril

## 2016-12-19 NOTE — Assessment & Plan Note (Signed)
No relapse 

## 2016-12-19 NOTE — Patient Instructions (Signed)
Wt Readings from Last 3 Encounters:  12/19/16 288 lb (130.6 kg)  08/08/16 299 lb 0.6 oz (135.6 kg)  03/16/16 298 lb (135.2 kg)

## 2016-12-19 NOTE — Assessment & Plan Note (Signed)
Xigduo $$$ 

## 2016-12-19 NOTE — Progress Notes (Signed)
Subjective:  Patient ID: [name], male    DOB: [birth year]  Age: 56 y.o. MRN: [id number]  CC: No chief complaint on file.   HPI [name] presents for DM, HTN, DM f/u C/o L shoulder - Dr Landau/MRI/Dosepac  Outpatient Medications Prior to Visit  Medication Sig Dispense Refill  . allopurinol (ZYLOPRIM) 100 MG tablet Take 1 tablet (100 mg total) by mouth 2 (two) times daily. 180 tablet 3  . aspirin EC 81 MG tablet Take 81 mg by mouth daily.    . Dapagliflozin-Metformin HCl ER (XIGDUO XR) 08-998 MG TB24 Take 1 tablet by mouth 2 (two) times daily. 180 tablet 3  . fosinopril (MONOPRIL) 10 MG tablet Take 1 tablet (10 mg total) by mouth daily. 90 tablet 3  . glucose blood test strip Use as instructed. DX E11.65 100 each 12  . Lancets (ACCU-CHEK MULTICLIX) lancets Use as instructed. DX: E11.65 102 each 11  . metoprolol succinate (TOPROL-XL) 25 MG 24 hr tablet Take 1 tablet (25 mg total) by mouth daily. 90 tablet 3  . Omega-3 Fatty Acids (FISH OIL CONCENTRATE PO) Take 2 capsules by mouth daily.     Marland Kitchen atorvastatin (LIPITOR) 40 MG tablet Take 1 tablet (40 mg total) by mouth daily. 90 tablet 3   No facility-administered medications prior to visit.     ROS Review of Systems  Constitutional: Negative for appetite change, fatigue and unexpected weight change.  HENT: Negative for congestion, nosebleeds, sneezing, sore throat and trouble swallowing.   Eyes: Negative for itching and visual disturbance.  Respiratory: Negative for cough.   Cardiovascular: Negative for chest pain, palpitations and leg swelling.  Gastrointestinal: Negative for abdominal distention, blood in stool, diarrhea and nausea.  Genitourinary: Negative for frequency and hematuria.  Musculoskeletal: Positive for back pain. Negative for gait problem, joint swelling and neck pain.  Skin: Negative for rash.  Neurological: Negative for dizziness, tremors, speech difficulty and weakness.  Psychiatric/Behavioral: Negative for  agitation, dysphoric mood, sleep disturbance and suicidal ideas. The patient is not nervous/anxious.     Objective:  BP 122/78 (BP Location: Right Arm, Patient Position: Sitting, Cuff Size: Large)   Pulse 70   Temp 98.2 F (36.8 C) (Oral)   Ht 6' 1.5" (1.867 m)   Wt 288 lb (130.6 kg)   SpO2 98%   BMI 37.48 kg/m   BP Readings from Last 3 Encounters:  12/19/16 122/78  08/08/16 126/84  03/16/16 120/82    Wt Readings from Last 3 Encounters:  12/19/16 288 lb (130.6 kg)  08/08/16 299 lb 0.6 oz (135.6 kg)  03/16/16 298 lb (135.2 kg)    Physical Exam  Constitutional: He is oriented to person, place, and time. He appears well-developed. No distress.  NAD  HENT:  Mouth/Throat: Oropharynx is clear and moist.  Eyes: Pupils are equal, round, and reactive to light. Conjunctivae are normal.  Neck: Normal range of motion. No JVD present. No thyromegaly present.  Cardiovascular: Normal rate, regular rhythm, normal heart sounds and intact distal pulses.  Exam reveals no gallop and no friction rub.   No murmur heard. Pulmonary/Chest: Effort normal and breath sounds normal. No respiratory distress. He has no wheezes. He has no rales. He exhibits no tenderness.  Abdominal: Soft. Bowel sounds are normal. He exhibits no distension and no mass. There is no tenderness. There is no rebound and no guarding.  Musculoskeletal: Normal range of motion. He exhibits edema. He exhibits no tenderness.  Lymphadenopathy:    He has no  cervical adenopathy.  Neurological: He is alert and oriented to person, place, and time. He has normal reflexes. No cranial nerve deficit. He exhibits normal muscle tone. He displays a negative Romberg sign. Coordination and gait normal.  Skin: Skin is warm and dry. No rash noted.  Psychiatric: He has a normal mood and affect. His behavior is normal. Judgment and thought content normal.  obese No edema L shoulder w/pain on ROM  Lab Results  Component Value Date   WBC 6.3  02/16/2016   HGB 16.6 02/16/2016   HCT 48.4 02/16/2016   PLT 246.0 02/16/2016   GLUCOSE 139 (H) 08/08/2016   CHOL 197 08/08/2016   TRIG 229.0 (H) 08/08/2016   HDL 61.00 08/08/2016   LDLDIRECT 96.0 08/08/2016   ALT 30 08/08/2016   AST 17 08/08/2016   NA 139 08/08/2016   K 5.3 (H) 08/08/2016   CL 104 08/08/2016   CREATININE 0.82 08/08/2016   BUN 22 08/08/2016   CO2 26 08/08/2016   TSH 1.46 02/16/2016   PSA 2.99 08/08/2016   INR 1.1 (H) 10/29/2014   HGBA1C 7.2 (H) 08/08/2016    Mr Shoulder Left Wo Contrast  Result Date: 10/29/2016 CLINICAL DATA:  Left shoulder pain with limited range of motion for 7 months. Progressively worsening. EXAM: MRI OF THE LEFT SHOULDER WITHOUT CONTRAST TECHNIQUE: Multiplanar, multisequence MR imaging of the shoulder was performed. No intravenous contrast was administered. COMPARISON:  None. FINDINGS: Rotator cuff: Moderate tendinosis of the supraspinatus tendon with a small partial-thickness articular surface tear of the anterior fibers. Mild tendinosis of the infraspinatus tendon. Teres minor tendon is intact. Subscapularis tendon is intact. Muscles: No atrophy or fatty replacement of nor abnormal signal within, the muscles of the rotator cuff. Biceps long head: Mild tendinosis of the intraarticular portion of the long head of the biceps tendon. Acromioclavicular Joint: Moderate arthropathy of the acromioclavicular joint. Type II acromion. Trace subacromial/subdeltoid bursal fluid. Glenohumeral Joint: No joint effusion. No chondral defect. Labrum: Grossly intact, but evaluation is limited by lack of intraarticular fluid. Bones:  No marrow abnormality, fracture or dislocation. Other: None. IMPRESSION: 1. Moderate tendinosis of the supraspinatus tendon with a small partial-thickness articular surface tear of the anterior fibers. 2. Mild tendinosis of the infraspinatus tendon. 3. Mild tendinosis of the intraarticular portion of the long head of the biceps tendon.  Electronically Signed   By: Kathreen Devoid   On: 10/29/2016 09:30    Assessment & Plan:   There are no diagnoses linked to this encounter. I am having [name] maintain his Omega-3 Fatty Acids (FISH OIL CONCENTRATE PO), aspirin EC, glucose blood, accu-chek multiclix, metoprolol succinate, fosinopril, Dapagliflozin-Metformin HCl ER, atorvastatin, and allopurinol.  No orders of the defined types were placed in this encounter.    Follow-up: No Follow-up on file.  Walker Kehr, MD

## 2016-12-20 NOTE — Addendum Note (Signed)
Addended by: Karren Cobble on: 12/20/2016 04:11 PM   Modules accepted: Orders

## 2016-12-26 ENCOUNTER — Other Ambulatory Visit (INDEPENDENT_AMBULATORY_CARE_PROVIDER_SITE_OTHER): Payer: PRIVATE HEALTH INSURANCE

## 2016-12-26 DIAGNOSIS — R972 Elevated prostate specific antigen [PSA]: Secondary | ICD-10-CM

## 2016-12-26 DIAGNOSIS — E1165 Type 2 diabetes mellitus with hyperglycemia: Secondary | ICD-10-CM

## 2016-12-26 DIAGNOSIS — M1000 Idiopathic gout, unspecified site: Secondary | ICD-10-CM | POA: Diagnosis not present

## 2016-12-26 DIAGNOSIS — IMO0001 Reserved for inherently not codable concepts without codable children: Secondary | ICD-10-CM

## 2016-12-26 LAB — BASIC METABOLIC PANEL
BUN: 27 mg/dL — ABNORMAL HIGH (ref 6–23)
CHLORIDE: 103 meq/L (ref 96–112)
CO2: 26 mEq/L (ref 19–32)
Calcium: 9.7 mg/dL (ref 8.4–10.5)
Creatinine, Ser: 0.74 mg/dL (ref 0.40–1.50)
GFR: 115.46 mL/min (ref >60.00)
Glucose, Bld: 124 mg/dL — ABNORMAL HIGH (ref 70–99)
POTASSIUM: 4.3 meq/L (ref 3.5–5.1)
Sodium: 137 mEq/L (ref 135–145)

## 2016-12-26 LAB — HEPATIC FUNCTION PANEL
ALK PHOS: 60 U/L (ref 39–117)
ALT: 25 U/L (ref 0–53)
AST: 15 U/L (ref 0–37)
Albumin: 4.3 g/dL (ref 3.5–5.2)
BILIRUBIN DIRECT: 0.1 mg/dL (ref 0.0–0.3)
BILIRUBIN TOTAL: 0.6 mg/dL (ref 0.2–1.2)
Total Protein: 6.9 g/dL (ref 6.0–8.3)

## 2016-12-26 LAB — PSA: PSA: 2.76 ng/mL (ref 0.10–4.00)

## 2016-12-26 LAB — HEMOGLOBIN A1C: HEMOGLOBIN A1C: 6.2 % (ref 4.6–6.5)

## 2016-12-27 ENCOUNTER — Encounter: Payer: Self-pay | Admitting: Internal Medicine

## 2017-01-03 ENCOUNTER — Encounter: Payer: Self-pay | Admitting: Internal Medicine

## 2017-01-05 ENCOUNTER — Encounter: Payer: Self-pay | Admitting: Cardiology

## 2017-01-07 ENCOUNTER — Telehealth: Payer: Self-pay | Admitting: Cardiology

## 2017-01-07 NOTE — Telephone Encounter (Signed)
Closed encounter °

## 2017-01-07 NOTE — Telephone Encounter (Signed)
Received msg from Dr. Percival Spanish that he will be taking care of this and wants to talk to the patient. Dayna Dunn PA-C

## 2017-01-08 ENCOUNTER — Telehealth: Payer: Self-pay | Admitting: Cardiology

## 2017-01-08 NOTE — Telephone Encounter (Signed)
MyChart message r/t surgical clearance copied below:  ----- Message from [name] to Minus Breeding, MD sent at 01/05/2017 6:58 PM -----   Dr. 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 Dr. Mardelle Matte has been treating me for months. He has recommended a shoulder manipulation under anesthesia at this point. His office requests your clearance for the procedure. They sent the form to your office. I would like to have this done quickly as I haven't been able to do normal activities for a long time. I can schedule as soon as they receive the form. I appreciate your help.  [name]   Routed to MD

## 2017-01-09 NOTE — Telephone Encounter (Signed)
Pt said he received on my chart that he had clearance for surgery. His question is,does he still need to keep his appt for tomorrow with Dr Warren Lacy for surgical clearance.He last saw Dr Percival Spanish in November of last year.

## 2017-01-10 ENCOUNTER — Ambulatory Visit: Payer: PRIVATE HEALTH INSURANCE | Admitting: Cardiology

## 2017-01-11 NOTE — Telephone Encounter (Signed)
Clearance routed via EPIC to Dr. Mardelle Matte. MyChart message sent to patient with info regarding appt/clearance.

## 2017-01-11 NOTE — Telephone Encounter (Signed)
The patient is not going for a high risk surgery.   Per modified Lee criteria the estimated rate of myocardial infarction, pulmonary embolism, ventricular fibrillation, cardiac arrest or complete heart block for this patient is 0.9%.  Therefore, based on ACC/AHA guidelines, the patient would be at acceptable risk for the planned procedure without further cardiovascular testing.

## 2017-01-11 NOTE — Telephone Encounter (Signed)
Message routed to MD/CMA to follow up on patient's question/sign off on clearance note so it can be routed to surgeon  Will patient need December appt for "clearance" when he is already cleared.Marland Kitchen And was to follow up PRN after last appt on Nov 2017?

## 2017-01-26 ENCOUNTER — Encounter: Payer: Self-pay | Admitting: Internal Medicine

## 2017-01-28 ENCOUNTER — Ambulatory Visit: Payer: PRIVATE HEALTH INSURANCE | Attending: Orthopedic Surgery | Admitting: Physical Therapy

## 2017-01-28 DIAGNOSIS — M25612 Stiffness of left shoulder, not elsewhere classified: Secondary | ICD-10-CM | POA: Insufficient documentation

## 2017-01-28 DIAGNOSIS — M25512 Pain in left shoulder: Secondary | ICD-10-CM | POA: Diagnosis present

## 2017-01-28 DIAGNOSIS — M6281 Muscle weakness (generalized): Secondary | ICD-10-CM

## 2017-01-28 NOTE — Patient Instructions (Signed)
Supine Shoulder Flexion   Lie on your back with elbow bent by your side. Punch arm straight up into the air. Raise up overhead and then back down towards legs in a pain free range of motion.     Cane Supine Shoulder Abduction Stretch  Lie on your back and grasp the cane on the shaft with the arm to be stretched. Grasp the bottom with the other hand and push your arm (the one to be stretched) up and away. Hold the stretch for 2 seconds before returning to the resting position.      AAROM Shoulder Internal Rotation  Holding on to a belt or towel with your affected arm behind your back, pull the opposite end up as pictured moving your arm into more internal rotation behind your back. Do this exercise in a relatively pain-free range of motion.     Shoulder isometric-flexion  Hold your arm against your side, with your elbow at a 90 degree angle. Without moving your body, push your fist straight into the wall ahead of you. You should feel your shoulder muscles contract. Repeat contract and relax motion.    Shoulder isometric-internal rotation   Hold your arm against your side, with your elbow at a 90 degree angle. Without actually moving your arm, push your hand into the wall. You should feel your shoulder muscles contract. Repeat contract and relax motion.    Shoulder Isometrics- External Rotation  Standing in a doorway or to the side of a wall, Place the affected hand/wrist against the wall.  Keeping your elbow bent at 90 degrees and close to your body, press your hand/wrist outward against the wall.  Do not move your shoulder or elbow.     SCAPULAR RETRACTIONS  Draw your shoulder blades back and down.

## 2017-01-28 NOTE — Therapy (Signed)
[facility] [address], [place], [place], [address] Phone: [phone]   Fax:  [phone]  Physical Therapy Evaluation  Patient Details  Name: [name] MRN: [id number] Date of Birth: [birth year] Referring Provider: Marchia Bond, MD  Encounter Date: 01/28/2017      PT End of Session - 01/28/17 2008    Visit Number 1   Number of Visits 11   Date for PT Re-Evaluation 02/25/17   Authorization Type [employer]   Authorization Time Period 01/28/17 to 02/25/17   PT Start Time 1530   PT Stop Time 1617   PT Time Calculation (min) 47 min   Activity Tolerance Patient tolerated treatment well   Behavior During Therapy [facility] for tasks assessed/performed      Past Medical History:  Diagnosis Date  . Achilles tendonitis   . Arthritis    lower back  . Diabetes ([place])   . H/O hiatal hernia   . Hypertension   . Lower back pain   . Sprain and strain of calcaneofibular ligament     Past Surgical History:  Procedure Laterality Date  . COLONOSCOPY N/A 07/14/2013   Procedure: COLONOSCOPY;  Surgeon: Garlan Fair, MD;  Location: [facility];  Service: Endoscopy;  Laterality: N/A;    There were no vitals filed for this visit.       Subjective Assessment - 01/28/17 1540    Subjective Pt reports that he underwent a Lt shoulder manipulation on 01/24/17. 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 He has been working on [employer] of exercises that he was able to find online, but continues to report issues with pain and stiffness.    Patient is accompained by: Family member   Limitations Lifting   How long can you sit comfortably? unlimited   How long can you stand comfortably? unlimited   How long can you walk comfortably? unlimited   Patient Stated Goals improve shoulder ROM and strength    Currently in Pain? Yes   Pain Score 2    Pain Location  Shoulder   Pain Orientation Left   Pain Descriptors / Indicators Aching   Pain Type Acute pain   Pain Radiating Towards none    Pain Onset 1 to 4 weeks ago   Pain Frequency Constant   Aggravating Factors  using his arm    Pain Relieving Factors pendulums, ice   Effect of Pain on Daily Activities moderate limitation            [facility] PT Assessment - 01/28/17 0001      Assessment   Medical Diagnosis s/p Lt shoulder MUA   Referring Provider Marchia Bond, MD   Onset Date/Surgical Date 01/24/17   Hand Dominance Right   Next MD Visit 02/06/17   Prior Therapy none      Precautions   Precautions None   Precaution Comments Pt provided with sling, but is unsure of wear instructions      Balance Screen   Has the patient fallen in the past 6 months No   Has the patient had a decrease in activity level because of a fear of falling?  No   Is the patient reluctant to leave their home because of a fear of falling?  No     Home Environment   Living Environment Private residence   Living Arrangements Spouse/significant other   Additional Comments Currently his family is driving and taking him to appointments  Prior Function   Level of Independence Independent     Cognition   Overall Cognitive Status Within Functional Limits for tasks assessed     Observation/Other Assessments   Observations Pt and his spouse were very attentive and eager to learn    Focus on Therapeutic Outcomes (FOTO)  46% limited     Sensation   Light Touch Appears Intact     ROM / Strength   AROM / PROM / Strength AROM;PROM;Strength     AROM   Overall AROM Comments RUE WNL   AROM Assessment Site Shoulder   Right/Left Shoulder Left   Left Shoulder Flexion 110 Degrees   Left Shoulder ABduction 60 Degrees   Left Shoulder Internal Rotation --  hand behind back to mid sacrum   Left Shoulder External Rotation 20 Degrees  at 0 deg abd; hand behind head to C1     PROM   PROM Assessment Site Shoulder    Right/Left Shoulder Left   Left Shoulder Flexion 150 Degrees   Left Shoulder ABduction 92 Degrees   Left Shoulder Internal Rotation --  WNL   Left Shoulder External Rotation 15 Degrees  at 90 deg abduction     Strength   Strength Assessment Site Shoulder   Right/Left Shoulder Left   Left Shoulder Flexion 4/5   Left Shoulder ABduction 4-/5   Left Shoulder Internal Rotation 4/5   Left Shoulder External Rotation 4/5     Palpation   Palpation comment non tender with palpation            Objective measurements completed on examination: See above findings.          [facility] Adult PT Treatment/Exercise - 01/28/17 0001      Exercises   Exercises Shoulder     Shoulder Exercises: Supine   Flexion AAROM;Left   Flexion Limitations 2x10 sec hold, HEP demo    ABduction AAROM;Left   ABduction Limitations 2x10 sec hold, HEP demo      Shoulder Exercises: Stretch   Internal Rotation Stretch 10 seconds   Internal Rotation Stretch Limitations Lt UE, HEP demo                PT Education - 01/28/17 2005    Education provided Yes   Education Details eval findings/POC; reviewed and made additions/adjustments to pt's HEP; reviewed parameters for ice application to decrease pain   Person(s) Educated Patient;Spouse   Methods Explanation;Tactile cues;Demonstration;Verbal cues;Handout   Comprehension Verbalized understanding;Returned demonstration          PT Short Term Goals - 01/28/17 2023      PT SHORT TERM GOAL #1   Title Pt will demo consistency and independence with his initial HEP to improve shoulder ROM and strength.    Time 2   Period Weeks   Status New   Target Date 02/11/17     PT SHORT TERM GOAL #2   Title Pt will demo improve shoulder abduction PROM to atleast 110 deg, which will assist with his ability to complete shoulder strengthening therex.    Time 2   Period Weeks   Status New           PT Long Term Goals - 01/28/17 2026      PT LONG  TERM GOAL #1   Title Pt will demo consistency and independence with his advanced HEP to prepare him for discharge home.    Time 4   Period Weeks   Status New   Target Date  02/25/17     PT LONG TERM GOAL #2   Title Pt will demo improved Lt shoulder AROM to atleast 140 deg flexion and 120 deg abduction to allow him to complete daily tasks such as combing and washing his hair.   Time 4   Period Weeks   Status New     PT LONG TERM GOAL #3   Title Pt will demo improved Lt shoulder strength to 5/5 MMT, which will assist with lifting small objects and placing them above shoulder height.    Time 4   Period Weeks   Status New     PT LONG TERM GOAL #4   Title Pt will report atleast 50% improvement in his LUE function, which will allow him to complete daily activites without assistance.    Time 4   Period Weeks   Status New                Plan - 02-23-17 07-31-09    Clinical Impression Statement Pt is a pleasant 56 y.o M referred to OPPT s/p a Lt shoulder manipulation under anesthesia on 01/24/17. He presents today with limitations in active and passive Lt shoulder ROM, decrease in Lt shoulder strength and pain with UE use, which is limiting his ability to complete daily activity without difficulty. He has been completing several exercises since his manipulation and reports that he has seen some improvements in his ROM with this. He would benefit from skilled PT to address his limitations in strength, ROM, flexibility and pain to facilitate his return to leisure and daily activity.    History and Personal Factors relevant to plan of care: diabetes   Clinical Presentation Stable   Clinical Presentation due to: ROM gradually improving since manipulation    Clinical Decision Making Low   Rehab Potential Good   PT Frequency Other (comment)  5x/week for 1 week, decreasing to 2x/week for the remaining 3 weeks    PT Duration 4 weeks   PT Treatment/Interventions Cryotherapy;Electrical  Stimulation;Moist Heat;Therapeutic exercise;Therapeutic activities;Neuromuscular re-education;Patient/family education;Manual techniques;Dry needling;Passive range of motion;Taping   PT Next Visit Plan shoulder ROM, scapular/glenohumeral mobility, modalities as needed to decrease pain   PT Home Exercise Plan isometrics 10x10", shoulder flexion/abduction/IR stretch    Consulted and Agree with Plan of Care Patient;Family member/caregiver   Family Member Consulted pt's wife       Patient will benefit from skilled therapeutic intervention in order to improve the following deficits and impairments:  Decreased activity tolerance, Decreased range of motion, Decreased strength, Hypomobility, Impaired UE functional use, Impaired flexibility, Pain  Visit Diagnosis: Stiffness of left shoulder, not elsewhere classified  Acute pain of left shoulder  Muscle weakness (generalized)      G-Codes - 02-23-2017 07-31-2100    Functional Assessment Tool Used (Outpatient Only) FOTO   Functional Limitation Other PT primary   Other PT Primary Current Status (D1497) At least 40 percent but less than 60 percent impaired, limited or restricted   Other PT Primary Goal Status (W2637) At least 20 percent but less than 40 percent impaired, limited or restricted       Problem List Patient Active Problem List   Diagnosis Date Noted  . Elevated PSA 02/17/2016  . Dyslipidemia 02/17/2016  . Diabetes mellitus type 2, uncontrolled ([place]) 02/15/2016  . Well adult exam 02/15/2016  . History of colonic polyps 02/15/2016  . RBBB 02/15/2016  . Essential hypertension 02/09/2015  . Right leg DVT ([place]) 10/30/2014  . Gout 10/30/2014  .  Stye external 10/30/2014  . Leg pain, right 10/29/2014  . ACHILLES TENDINITIS 01/12/2008  . SPRAIN AND STRAIN OF CALCANEOFIBULAR 01/12/2008    9:12 PM,01/28/17 Elly Modena PT, DPT [facility] [address], [place], [place], [address] Phone: [phone]   Fax:  [phone]  Name: [name] MRN: [id number] Date of Birth: July 27, 1960

## 2017-01-29 ENCOUNTER — Ambulatory Visit: Payer: PRIVATE HEALTH INSURANCE | Admitting: Physical Therapy

## 2017-01-29 DIAGNOSIS — M25612 Stiffness of left shoulder, not elsewhere classified: Secondary | ICD-10-CM

## 2017-01-29 DIAGNOSIS — M6281 Muscle weakness (generalized): Secondary | ICD-10-CM

## 2017-01-29 DIAGNOSIS — M25512 Pain in left shoulder: Secondary | ICD-10-CM

## 2017-01-29 NOTE — Patient Instructions (Signed)
     Prior to or during exercise use heat  Can use TENS before or during exercise  Low load, long duration stretch:  Hold as long as possible  Do a variety of stretches every 2 hours for 2-3 minutes:    Lying down :  Overhead   Seated at the kitchen table, slide forward  Slide on the stair rail (thumb up)  Standing roll ball up the wall  Seated pulleys     After exercise use cold    Ruben Im PT [facility] [address] [place], [place] [address] Phone # [phone] Fax [phone]

## 2017-01-29 NOTE — Therapy (Signed)
[facility] [address] [place], [place], [address] Phone: [phone]   Fax:  [phone]  Physical Therapy Treatment  Patient Details  Name: [name] MRN: [id number] Date of Birth: Sep 20, 1960 Referring Provider: Marchia Bond, MD  Encounter Date: 01/29/2017      PT End of Session - 01/29/17 1721    Visit Number 2   Number of Visits 11   Date for PT Re-Evaluation 02/25/17   Authorization Type Medcost/[employer]   Authorization Time Period 01/28/17 to 02/25/17   PT Start Time 1400   PT Stop Time 1440   PT Time Calculation (min) 40 min   Activity Tolerance Patient tolerated treatment well      Past Medical History:  Diagnosis Date  . Achilles tendonitis   . Arthritis    lower back  . Diabetes ([place])   . H/O hiatal hernia   . Hypertension   . Lower back pain   . Sprain and strain of calcaneofibular ligament     Past Surgical History:  Procedure Laterality Date  . COLONOSCOPY N/A 07/14/2013   Procedure: COLONOSCOPY;  Surgeon: Garlan Fair, MD;  [facility];  Service: Endoscopy;  Laterality: N/A;    There were no vitals filed for this visit.      Subjective Assessment - 01/29/17 1357    Subjective It's fine when at rest.  Only hurts with certain movements.  Reports he's been doing his HEP.  Took a pain pill before appt.  His son drove him to PT.   Patient is accompained by: Family member  son   Currently in Pain? Yes   Pain Score 1    Pain Location Shoulder   Pain Orientation Left   Pain Type Acute pain   Aggravating Factors  using arm    Pain Relieving Factors walking                         [facility] Adult PT Treatment/Exercise - 01/29/17 0001      Shoulder Exercises: Supine   Flexion AAROM   Flexion Limitations with UE Ranger and other hand assist     Shoulder Exercises: Seated   Flexion AAROM;Left   Flexion Limitations on table top   Other Seated Exercises  scapular squeezes 10x     Shoulder Exercises: Standing   Other Standing Exercises abduction on stair railing with thumb up 8x   Other Standing Exercises UE Ranger on wall L10, 20, 25 10x each     Shoulder Exercises: Pulleys   Flexion 3 minutes   ABduction 1 minute   ABduction Limitations cues for thumb up     Shoulder Exercises: Therapy Ball   Flexion 10 reps   Flexion Limitations red physioball on wall      Shoulder Exercises: Isometric Strengthening   Flexion --  verbal review                PT Education - 01/29/17 1720    Education provided Yes   Education Details various ways to stretch every 2 hours 2-3 minutes    Person(s) Educated Patient;Child(ren)   Methods Explanation;Demonstration;Handout   Comprehension Verbalized understanding;Returned demonstration          PT Short Term Goals - 01/28/17 2023      PT SHORT TERM GOAL #1   Title Pt will demo consistency and independence with his initial HEP to improve shoulder ROM and strength.    Time 2  Period Weeks   Status New   Target Date 02/11/17     PT SHORT TERM GOAL #2   Title Pt will demo improve shoulder abduction PROM to atleast 110 deg, which will assist with his ability to complete shoulder strengthening therex.    Time 2   Period Weeks   Status New           PT Long Term Goals - Feb 14, 2017 07/22/24      PT LONG TERM GOAL #1   Title Pt will demo consistency and independence with his advanced HEP to prepare him for discharge home.    Time 4   Period Weeks   Status New   Target Date 02/25/17     PT LONG TERM GOAL #2   Title Pt will demo improved Lt shoulder AROM to atleast 140 deg flexion and 120 deg abduction to allow him to complete daily tasks such as combing and washing his hair.   Time 4   Period Weeks   Status New     PT LONG TERM GOAL #3   Title Pt will demo improved Lt shoulder strength to 5/5 MMT, which will assist with lifting small objects and placing them above shoulder  height.    Time 4   Period Weeks   Status New     PT LONG TERM GOAL #4   Title Pt will report atleast 50% improvement in his LUE function, which will allow him to complete daily activites without assistance.    Time 4   Period Weeks   Status New               Plan - 01/29/17 1721    Clinical Impression Statement The patient demonstrates good carryover with initial home instructions from yesterday.  He is able to add additional ex's for ROM today with minimal increase in pain.  Active abduction is most painful movement.  Less discomfort with thumb up positioning.  Encouraged use of home modalities (heat, ice and home TENS) for pain control and to facilitate ROM.    Rehab Potential Good   PT Frequency Other (comment)   PT Duration 4 weeks   PT Treatment/Interventions Cryotherapy;Electrical Stimulation;Moist Heat;Therapeutic exercise;Therapeutic activities;Neuromuscular re-education;Patient/family education;Manual techniques;Dry needling;Passive range of motion;Taping   PT Next Visit Plan shoulder ROM, scapular/glenohumeral mobility, modalities as needed to decrease pain      Patient will benefit from skilled therapeutic intervention in order to improve the following deficits and impairments:  Decreased activity tolerance, Decreased range of motion, Decreased strength, Hypomobility, Impaired UE functional use, Impaired flexibility, Pain  Visit Diagnosis: Stiffness of left shoulder, not elsewhere classified  Acute pain of left shoulder  Muscle weakness (generalized)       G-Codes - February 14, 2017 July 22, 2100    Functional Assessment Tool Used (Outpatient Only) FOTO   Functional Limitation Other PT primary   Other PT Primary Current Status (H2992) At least 40 percent but less than 60 percent impaired, limited or restricted   Other PT Primary Goal Status (E2683) At least 20 percent but less than 40 percent impaired, limited or restricted      Problem List Patient Active Problem List    Diagnosis Date Noted  . Elevated PSA 02/17/2016  . Dyslipidemia 02/17/2016  . Diabetes mellitus type 2, uncontrolled ([place]) 02/15/2016  . Well adult exam 02/15/2016  . History of colonic polyps 02/15/2016  . RBBB 02/15/2016  . Essential hypertension 02/09/2015  . Right leg DVT ([place]) 10/30/2014  . Gout 10/30/2014  .  Stye external 10/30/2014  . Leg pain, right 10/29/2014  . ACHILLES TENDINITIS 01/12/2008  . SPRAIN AND STRAIN OF CALCANEOFIBULAR 01/12/2008   Ruben Im, PT 01/29/17 5:25 PM Phone: [phone] Fax: [phone]  Alvera Singh 01/29/2017, 5:25 PM   [facility] [address], [place], [place], [address] Phone: [phone]   Fax:  [phone]  Name: [name] MRN: [id number] Date of Birth: [birth year]

## 2017-01-29 NOTE — Therapy (Signed)
[facility] [address] [place], [place], [address] Phone: [phone]   Fax:  [phone]  Physical Therapy Treatment  Patient Details  Name: [name] MRN: [id number] Date of Birth: 06/16/1960 Referring Provider: Marchia Bond, MD  Encounter Date: 01/29/2017      PT End of Session - 01/29/17 1721    Visit Number 2   Number of Visits 11   Date for PT Re-Evaluation 02/25/17   Authorization Type [employer]   Authorization Time Period 01/28/17 to 02/25/17   PT Start Time 1400   PT Stop Time 1440   PT Time Calculation (min) 40 min   Activity Tolerance Patient tolerated treatment well      Past Medical History:  Diagnosis Date  . Achilles tendonitis   . Arthritis    lower back  . Diabetes ([place])   . H/O hiatal hernia   . Hypertension   . Lower back pain   . Sprain and strain of calcaneofibular ligament     Past Surgical History:  Procedure Laterality Date  . COLONOSCOPY N/A 07/14/2013   Procedure: COLONOSCOPY;  Surgeon: Garlan Fair, MD;  [facility]: [facility];  Service: Endoscopy;  Laterality: N/A;    There were no vitals filed for this visit.      Subjective Assessment - 01/29/17 1357    Subjective It's fine when at rest.  Only hurts with certain movements.  Reports he's been doing his HEP.  Took a pain pill before appt.  His son drove him to PT.   Patient is accompained by: Family member  son   Pertinent History pronounced "Ah-Gee"   Currently in Pain? Yes   Pain Score 1    Pain Location Shoulder   Pain Orientation Left   Pain Type Acute pain   Aggravating Factors  using arm    Pain Relieving Factors walking                         [facility] Adult PT Treatment/Exercise - 01/29/17 0001      Shoulder Exercises: Supine   Flexion AAROM   Flexion Limitations with UE Ranger and other hand assist     Shoulder Exercises: Seated   Flexion AAROM;Left   Flexion  Limitations on table top   Other Seated Exercises scapular squeezes 10x     Shoulder Exercises: Standing   Other Standing Exercises abduction on stair railing with thumb up 8x   Other Standing Exercises UE Ranger on wall L10, 20, 25 10x each     Shoulder Exercises: Pulleys   Flexion 3 minutes   ABduction 1 minute   ABduction Limitations cues for thumb up     Shoulder Exercises: Therapy Ball   Flexion 10 reps   Flexion Limitations red physioball on wall      Shoulder Exercises: Isometric Strengthening   Flexion --  verbal review                PT Education - 01/29/17 1720    Education provided Yes   Education Details various ways to stretch every 2 hours 2-3 minutes    Person(s) Educated Patient;Child(ren)   Methods Explanation;Demonstration;Handout   Comprehension Verbalized understanding;Returned demonstration          PT Short Term Goals - 01/28/17 2023      PT SHORT TERM GOAL #1   Title Pt will demo consistency and independence with his initial HEP to improve shoulder ROM and  strength.    Time 2   Period Weeks   Status New   Target Date 02/11/17     PT SHORT TERM GOAL #2   Title Pt will demo improve shoulder abduction PROM to atleast 110 deg, which will assist with his ability to complete shoulder strengthening therex.    Time 2   Period Weeks   Status New           PT Long Term Goals - 02/08/2017 16-Jul-2024      PT LONG TERM GOAL #1   Title Pt will demo consistency and independence with his advanced HEP to prepare him for discharge home.    Time 4   Period Weeks   Status New   Target Date 02/25/17     PT LONG TERM GOAL #2   Title Pt will demo improved Lt shoulder AROM to atleast 140 deg flexion and 120 deg abduction to allow him to complete daily tasks such as combing and washing his hair.   Time 4   Period Weeks   Status New     PT LONG TERM GOAL #3   Title Pt will demo improved Lt shoulder strength to 5/5 MMT, which will assist with lifting  small objects and placing them above shoulder height.    Time 4   Period Weeks   Status New     PT LONG TERM GOAL #4   Title Pt will report atleast 50% improvement in his LUE function, which will allow him to complete daily activites without assistance.    Time 4   Period Weeks   Status New               Plan - 01/29/17 1721    Clinical Impression Statement The patient demonstrates good carryover with initial home instructions from yesterday.  He is able to add additional ex's for ROM today with minimal increase in pain.  Active abduction is most painful movement.  Less discomfort with thumb up positioning.  Encouraged use of home modalities (heat, ice and home TENS) for pain control and to facilitate ROM.    Rehab Potential Good   PT Frequency Other (comment)   PT Duration 4 weeks   PT Treatment/Interventions Cryotherapy;Electrical Stimulation;Moist Heat;Therapeutic exercise;Therapeutic activities;Neuromuscular re-education;Patient/family education;Manual techniques;Dry needling;Passive range of motion;Taping   PT Next Visit Plan shoulder ROM, scapular/glenohumeral mobility, modalities as needed to decrease pain      Patient will benefit from skilled therapeutic intervention in order to improve the following deficits and impairments:  Decreased activity tolerance, Decreased range of motion, Decreased strength, Hypomobility, Impaired UE functional use, Impaired flexibility, Pain  Visit Diagnosis: Stiffness of left shoulder, not elsewhere classified  Acute pain of left shoulder  Muscle weakness (generalized)       G-Codes - 08-Feb-2017 07-16-2100    Functional Assessment Tool Used (Outpatient Only) FOTO   Functional Limitation Other PT primary   Other PT Primary Current Status (W1027) At least 40 percent but less than 60 percent impaired, limited or restricted   Other PT Primary Goal Status (O5366) At least 20 percent but less than 40 percent impaired, limited or restricted       Problem List Patient Active Problem List   Diagnosis Date Noted  . Elevated PSA 02/17/2016  . Dyslipidemia 02/17/2016  . Diabetes mellitus type 2, uncontrolled ([place]) 02/15/2016  . Well adult exam 02/15/2016  . History of colonic polyps 02/15/2016  . RBBB 02/15/2016  . Essential hypertension 02/09/2015  . Right leg  DVT ([place]) 10/30/2014  . Gout 10/30/2014  . Stye external 10/30/2014  . Leg pain, right 10/29/2014  . ACHILLES TENDINITIS 01/12/2008  . SPRAIN AND STRAIN OF CALCANEOFIBULAR 01/12/2008   Ruben Im, PT 01/29/17 5:27 PM Phone: [phone] Fax: [phone]  Alvera Singh 01/29/2017, 5:27 PM  [facility] [address] [place], [place], [address] Phone: [phone]   Fax:  [phone]  Name: [name] MRN: [id number] Date of Birth: Mar 05, 1961

## 2017-01-30 ENCOUNTER — Ambulatory Visit: Payer: PRIVATE HEALTH INSURANCE | Admitting: Physical Therapy

## 2017-01-30 DIAGNOSIS — M25612 Stiffness of left shoulder, not elsewhere classified: Secondary | ICD-10-CM | POA: Diagnosis not present

## 2017-01-30 DIAGNOSIS — M6281 Muscle weakness (generalized): Secondary | ICD-10-CM

## 2017-01-30 DIAGNOSIS — M25512 Pain in left shoulder: Secondary | ICD-10-CM

## 2017-01-30 NOTE — Patient Instructions (Signed)
  Shoulder Extension Stretch  Grab onto broomstick/ lip of drawer with palms facing up toward ceiling. While holding onto broomstick/ lip of drawer, lunge forward on one foot letting arms stretch back behind you. Come back to starting position (step 1). Repeat process but lunge forward on opposite foot.    several times a day, hold up to [place] [address], [place], [place] [address] Phone # [phone] Fax [phone]

## 2017-01-30 NOTE — Therapy (Signed)
[facility] [address] [place], [place], [address] Phone: [phone]   Fax:  [phone]  Physical Therapy Treatment  Patient Details  Name: [name] MRN: [id number] Date of Birth: Mar 28, 1961 Referring Provider: Marchia Bond, MD  Encounter Date: 01/30/2017      PT End of Session - 01/30/17 1429    Visit Number 3   Number of Visits 11   Date for PT Re-Evaluation 02/25/17   Authorization Type [employer]   Authorization Time Period 01/28/17 to 02/25/17   PT Start Time 1402   PT Stop Time 1443   PT Time Calculation (min) 41 min   Activity Tolerance Patient tolerated treatment well   Behavior During Therapy [facility] for tasks assessed/performed      Past Medical History:  Diagnosis Date  . Achilles tendonitis   . Arthritis    lower back  . Diabetes ([place])   . H/O hiatal hernia   . Hypertension   . Lower back pain   . Sprain and strain of calcaneofibular ligament     Past Surgical History:  Procedure Laterality Date  . COLONOSCOPY N/A 07/14/2013   Procedure: COLONOSCOPY;  Surgeon: Garlan Fair, MD;  Location: [facility];  Service: Endoscopy;  Laterality: N/A;    There were no vitals filed for this visit.      Subjective Assessment - 01/30/17 1405    Subjective Pt reports that he was able to make his own UE ranger at home. He does not have much pain currently, but he does have increased pain with his ROM exercises.    Patient is accompained by: Family member  son   Pertinent History pronounced "Ah-Gee"   Currently in Pain? Yes   Pain Score 1    Pain Location Shoulder   Pain Orientation Left   Pain Descriptors / Indicators Aching   Pain Type Acute pain   Pain Radiating Towards none    Pain Onset 1 to 4 weeks ago   Pain Frequency Constant   Aggravating Factors  using/stretching the arm    Pain Relieving Factors walking, resting    Effect of Pain on Daily Activities moderate limitation                           [facility] Adult PT Treatment/Exercise - 01/30/17 0001      Shoulder Exercises: Supine   Protraction Both;20 reps     Shoulder Exercises: Seated   Flexion AAROM;Both;15 reps   Flexion Limitations seated   Other Seated Exercises scaption AAROM LUE only x15 reps      Shoulder Exercises: Standing   Other Standing Exercises finger ladder with LUE flexion 5x10 sec hold, reaching #28     Shoulder Exercises: Pulleys   Flexion 3 minutes   ABduction 3 minutes   ABduction Limitations thumb up      Shoulder Exercises: Stretch   Other Shoulder Stretches Lt shoulder extension stretch 5x20 sec hold      Manual Therapy   Manual Therapy Joint mobilization   Joint Mobilization Lt scapula upward rotation mobilizations                PT Education - 01/30/17 1429    Education provided Yes   Education Details technique with therex; addition to HEP    Person(s) Educated Patient   Methods Explanation;Verbal cues   Comprehension Verbalized understanding;Returned demonstration          PT Short  Term Goals - 01/28/17 2023      PT SHORT TERM GOAL #1   Title Pt will demo consistency and independence with his initial HEP to improve shoulder ROM and strength.    Time 2   Period Weeks   Status New   Target Date 02/11/17     PT SHORT TERM GOAL #2   Title Pt will demo improve shoulder abduction PROM to atleast 110 deg, which will assist with his ability to complete shoulder strengthening therex.    Time 2   Period Weeks   Status New           PT Long Term Goals - 01/28/17 2026      PT LONG TERM GOAL #1   Title Pt will demo consistency and independence with his advanced HEP to prepare him for discharge home.    Time 4   Period Weeks   Status New   Target Date 02/25/17     PT LONG TERM GOAL #2   Title Pt will demo improved Lt shoulder AROM to atleast 140 deg flexion and 120 deg abduction to allow him to complete daily tasks such as  combing and washing his hair.   Time 4   Period Weeks   Status New     PT LONG TERM GOAL #3   Title Pt will demo improved Lt shoulder strength to 5/5 MMT, which will assist with lifting small objects and placing them above shoulder height.    Time 4   Period Weeks   Status New     PT LONG TERM GOAL #4   Title Pt will report atleast 50% improvement in his LUE function, which will allow him to complete daily activites without assistance.    Time 4   Period Weeks   Status New               Plan - 01/30/17 1438    Clinical Impression Statement Pt is making steady progress towards his goals, reporting continued HEP adherence throughout the day. He demonstrates improved Lt shoulder flexion and scaption active assisted ROM this session to near full ranges, compared to his initial evaluation. Continued with therex to promote shoulder strength and mobility, including scapular mobilizations to assist with shoulder elevation. Made additions to pt's HEP and he reported no increase in pain from the start of today's session.    Rehab Potential Good   PT Frequency Other (comment)   PT Duration 4 weeks   PT Treatment/Interventions Cryotherapy;Electrical Stimulation;Moist Heat;Therapeutic exercise;Therapeutic activities;Neuromuscular re-education;Patient/family education;Manual techniques;Dry needling;Passive range of motion;Taping   PT Next Visit Plan shoulder ROM, glenohumeral mobility, modalities as needed to decrease pain; lower trap and RTC activation   PT Home Exercise Plan isometrics 10x10", shoulder flexion/abduction/IR stretch, shoulder ext stretch    Consulted and Agree with Plan of Care Patient      Patient will benefit from skilled therapeutic intervention in order to improve the following deficits and impairments:  Decreased activity tolerance, Decreased range of motion, Decreased strength, Hypomobility, Impaired UE functional use, Impaired flexibility, Pain  Visit  Diagnosis: Stiffness of left shoulder, not elsewhere classified  Acute pain of left shoulder  Muscle weakness (generalized)     Problem List Patient Active Problem List   Diagnosis Date Noted  . Elevated PSA 02/17/2016  . Dyslipidemia 02/17/2016  . Diabetes mellitus type 2, uncontrolled ([place]) 02/15/2016  . Well adult exam 02/15/2016  . History of colonic polyps 02/15/2016  . RBBB 02/15/2016  . Essential  hypertension 02/09/2015  . Right leg DVT ([place]) 10/30/2014  . Gout 10/30/2014  . Stye external 10/30/2014  . Leg pain, right 10/29/2014  . ACHILLES TENDINITIS 01/12/2008  . SPRAIN AND STRAIN OF CALCANEOFIBULAR 01/12/2008    2:49 PM,01/30/17 Elly Modena PT, DPT [place] [facility] [address] [place], [place], [address] Phone: [phone]   Fax:  [phone]  Name: [name] MRN: [id number] Date of Birth: 14-Aug-1960

## 2017-01-31 ENCOUNTER — Ambulatory Visit: Payer: PRIVATE HEALTH INSURANCE | Admitting: Physical Therapy

## 2017-01-31 DIAGNOSIS — M25512 Pain in left shoulder: Secondary | ICD-10-CM

## 2017-01-31 DIAGNOSIS — M6281 Muscle weakness (generalized): Secondary | ICD-10-CM

## 2017-01-31 DIAGNOSIS — M25612 Stiffness of left shoulder, not elsewhere classified: Secondary | ICD-10-CM | POA: Diagnosis not present

## 2017-01-31 NOTE — Patient Instructions (Signed)
   Supine Shoulder Flexion  Supine Resisted Scaption with band  Lying on your back with your knees bent, hold a resistance band in one hand and anchor it along your side. With the other hand grasping the band, keep your elbow straight and thumb pointed towards the ceiling, then bring your arm back diagonally so that your thumb is now pointed towards the table. Slowly return to starting position.      Do this in your recliner or laying flat when pulling the arm out directly in front (no dowel), use other arm to assist if any pain.  also complete at 45 deg while partially on your side/back.

## 2017-02-01 ENCOUNTER — Encounter: Payer: PRIVATE HEALTH INSURANCE | Admitting: Physical Therapy

## 2017-02-01 NOTE — Therapy (Signed)
[facility] [address] [place], [place], [address] Phone: [phone]   Fax:  [phone]  Physical Therapy Treatment  Patient Details  Name: [name] MRN: [id number] Date of Birth: [birth year] Referring Provider: Marchia Bond, MD  Encounter Date: 01/31/2017      PT End of Session - 02/01/17 0724    Visit Number 4   Number of Visits 11   Date for PT Re-Evaluation 02/25/17   Authorization Type [employer]   Authorization Time Period 01/28/17 to 02/25/17   Authorization - Visit Number 4   Authorization - Number of Visits 30   PT Start Time 1532   PT Stop Time 4403   PT Time Calculation (min) 42 min   Activity Tolerance Patient tolerated treatment well   Behavior During Therapy [facility] for tasks assessed/performed      Past Medical History:  Diagnosis Date  . Achilles tendonitis   . Arthritis    lower back  . Diabetes ([place])   . H/O hiatal hernia   . Hypertension   . Lower back pain   . Sprain and strain of calcaneofibular ligament     Past Surgical History:  Procedure Laterality Date  . COLONOSCOPY N/A 07/14/2013   Procedure: COLONOSCOPY;  Surgeon: Garlan Fair, MD;  [facility];  Service: Endoscopy;  Laterality: N/A;    There were no vitals filed for this visit.      Subjective Assessment - 01/31/17 1534    Subjective Pt reports that he did not take any of his pain medication today, so he does have some aching currently. His home pulley arrived, so he tried that today.   Patient is accompained by: Family member  son   Pertinent History pronounced "Ah-Gee"   Currently in Pain? Yes   Pain Score 3    Pain Location Shoulder   Pain Orientation Left;Anterior   Pain Descriptors / Indicators Aching   Pain Type Acute pain   Pain Radiating Towards none    Pain Onset 1 to 4 weeks ago   Pain Frequency Constant   Aggravating Factors  stretching and elevating his arm    Pain  Relieving Factors walking, resting, medication                         [facility] Adult PT Treatment/Exercise - 02/01/17 0001      Shoulder Exercises: Supine   Flexion Left;AROM;20 reps   Flexion Limitations RUE assist with eccentric lower to avoid increase in pain      Shoulder Exercises: Sidelying   Other Sidelying Exercises scaption elevation, LUE only x15 reps     Shoulder Exercises: Standing   Row 15 reps   Theraband Level (Shoulder Row) Level 4 (Blue);Level 3 (Green)   Row Limitations cues to increase scapular retraction      Shoulder Exercises: Pulleys   Flexion 3 minutes;Other (comment)  LUE   Flexion Limitations scaption x3 min, LUE     Shoulder Exercises: Stretch   External Rotation Stretch 5 reps;30 seconds;Other (comment)  ~30 deg abduction                PT Education - 01/31/17 1603    Education provided Yes   Education Details importance of completing AAROM and AROM exercises without pain and adjusting positions to address this at home   Person(s) Educated Patient   Methods Explanation;Handout   Comprehension Verbalized understanding;Returned demonstration  PT Short Term Goals - 01/28/17 2023      PT SHORT TERM GOAL #1   Title Pt will demo consistency and independence with his initial HEP to improve shoulder ROM and strength.    Time 2   Period Weeks   Status New   Target Date 02/11/17     PT SHORT TERM GOAL #2   Title Pt will demo improve shoulder abduction PROM to atleast 110 deg, which will assist with his ability to complete shoulder strengthening therex.    Time 2   Period Weeks   Status New           PT Long Term Goals - 01/28/17 2026      PT LONG TERM GOAL #1   Title Pt will demo consistency and independence with his advanced HEP to prepare him for discharge home.    Time 4   Period Weeks   Status New   Target Date 02/25/17     PT LONG TERM GOAL #2   Title Pt will demo improved Lt shoulder AROM to  atleast 140 deg flexion and 120 deg abduction to allow him to complete daily tasks such as combing and washing his hair.   Time 4   Period Weeks   Status New     PT LONG TERM GOAL #3   Title Pt will demo improved Lt shoulder strength to 5/5 MMT, which will assist with lifting small objects and placing them above shoulder height.    Time 4   Period Weeks   Status New     PT LONG TERM GOAL #4   Title Pt will report atleast 50% improvement in his LUE function, which will allow him to complete daily activites without assistance.    Time 4   Period Weeks   Status New               Plan - 02/01/17 0726    Clinical Impression Statement Pt continues to be consistently adhering to his HEP without reported concerns or difficulty. Due to his improving shoulder elevation ROM, introduced pain free active ROM in gravity eliminated positions this session. Pt was able to complete without increase in pain. Also conitnued with therex to improve shoulder mobility into external rotation. Will follow up with pt 2x/week at this point due to his consistency at home and steady progress.    Rehab Potential Good   PT Frequency Other (comment)   PT Duration 4 weeks   PT Treatment/Interventions Cryotherapy;Electrical Stimulation;Moist Heat;Therapeutic exercise;Therapeutic activities;Neuromuscular re-education;Patient/family education;Manual techniques;Dry needling;Passive range of motion;Taping   PT Next Visit Plan shoulder ROM, glenohumeral mobility, modalities as needed to decrease pain; lower trap and RTC activation   PT Home Exercise Plan isometrics 10x10", shoulder flexion/abduction/IR stretch, shoulder ext stretch    Consulted and Agree with Plan of Care Patient      Patient will benefit from skilled therapeutic intervention in order to improve the following deficits and impairments:  Decreased activity tolerance, Decreased range of motion, Decreased strength, Hypomobility, Impaired UE functional  use, Impaired flexibility, Pain  Visit Diagnosis: Stiffness of left shoulder, not elsewhere classified  Acute pain of left shoulder  Muscle weakness (generalized)     Problem List Patient Active Problem List   Diagnosis Date Noted  . Elevated PSA 02/17/2016  . Dyslipidemia 02/17/2016  . Diabetes mellitus type 2, uncontrolled ([place]) 02/15/2016  . Well adult exam 02/15/2016  . History of colonic polyps 02/15/2016  . RBBB 02/15/2016  . Essential  hypertension 02/09/2015  . Right leg DVT ([place]) 10/30/2014  . Gout 10/30/2014  . Stye external 10/30/2014  . Leg pain, right 10/29/2014  . ACHILLES TENDINITIS 01/12/2008  . SPRAIN AND STRAIN OF CALCANEOFIBULAR 01/12/2008    7:35 AM,02/01/17 Elly Modena PT, DPT [place] [facility] [address] [place], [place], [address] Phone: [phone]   Fax:  [phone]  Name: [name] MRN: [id number] Date of Birth: [birth year]

## 2017-02-04 ENCOUNTER — Ambulatory Visit: Payer: PRIVATE HEALTH INSURANCE | Admitting: Physical Therapy

## 2017-02-04 DIAGNOSIS — M25612 Stiffness of left shoulder, not elsewhere classified: Secondary | ICD-10-CM

## 2017-02-04 DIAGNOSIS — M25512 Pain in left shoulder: Secondary | ICD-10-CM

## 2017-02-04 DIAGNOSIS — M6281 Muscle weakness (generalized): Secondary | ICD-10-CM

## 2017-02-04 NOTE — Therapy (Signed)
[facility] [address] [place], [place], [address] Phone: [phone]   Fax:  [phone]  Physical Therapy Treatment  Patient Details  Name: [name] MRN: [id number] Date of Birth: 10/25/1960 Referring Provider: Marchia Bond, MD  Encounter Date: 02/04/2017      PT End of Session - 02/04/17 1550    Visit Number 5   Number of Visits 11   Date for PT Re-Evaluation 02/25/17   Authorization Type [employer]   Authorization Time Period 01/28/17 to 02/25/17   Authorization - Visit Number 5   Authorization - Number of Visits 30   PT Start Time 7078   PT Stop Time 1610   PT Time Calculation (min) 39 min   Activity Tolerance Patient tolerated treatment well   Behavior During Therapy [facility] for tasks assessed/performed      Past Medical History:  Diagnosis Date  . Achilles tendonitis   . Arthritis    lower back  . Diabetes ([place])   . H/O hiatal hernia   . Hypertension   . Lower back pain   . Sprain and strain of calcaneofibular ligament     Past Surgical History:  Procedure Laterality Date  . COLONOSCOPY N/A 07/14/2013   Procedure: COLONOSCOPY;  Surgeon: Garlan Fair, MD;  [facility];  Service: Endoscopy;  Laterality: N/A;    There were no vitals filed for this visit.      Subjective Assessment - 02/04/17 1534    Subjective Pt reports that yesterday was pretty rough, so he took thinks easy the rest of the day and today. He is still taking his Tylenol for pain management    Patient is accompained by: Family member  son   Pertinent History pronounced "Ah-Gee"   Currently in Pain? Yes   Pain Score 1    Pain Location Shoulder   Pain Orientation Left;Anterior   Pain Descriptors / Indicators Aching   Pain Type Acute pain   Pain Radiating Towards none    Pain Onset 1 to 4 weeks ago   Pain Frequency Constant   Aggravating Factors  stretching and elevating his arm.              [facility] PT Assessment - 02/04/17 0001      AROM   Left Shoulder Flexion 120 Degrees   Left Shoulder ABduction 137 Degrees  scapular plane    Left Shoulder External Rotation 40 Degrees  elbow by side      PROM   Left Shoulder Flexion 155 Degrees   Left Shoulder ABduction 100 Degrees   Left Shoulder External Rotation 40 Degrees                     [facility] Adult PT Treatment/Exercise - 02/04/17 0001      Shoulder Exercises: Supine   Flexion Left;15 reps   Shoulder Flexion Weight (lbs) 2   Flexion Limitations incline, RUE assist as needed to assist with pain     Shoulder Exercises: Prone   Other Prone Exercises incline plank 3x30 sec      Shoulder Exercises: Pulleys   Flexion 2 minutes   ABduction 2 minutes   ABduction Limitations scapular plane      Shoulder Exercises: Stretch   External Rotation Stretch 5 reps;Other (comment);30 seconds  AAROM, pt encouraged to remain in low pain level    Other Shoulder Stretches Lt shoulder extension stretch 3x30 sec  PT Education - 02/04/17 1846    Education provided Yes   Education Details proper technique with therex; discussed improvements in ROM from start of rehab    Person(s) Educated Patient   Methods Explanation;Verbal cues   Comprehension Verbalized understanding;Returned demonstration          PT Short Term Goals - 02/04/17 1851      PT SHORT TERM GOAL #1   Title Pt will demo consistency and independence with his initial HEP to improve shoulder ROM and strength.    Time 2   Period Weeks   Status Achieved     PT SHORT TERM GOAL #2   Title Pt will demo improve shoulder abduction PROM to atleast 110 deg, which will assist with his ability to complete shoulder strengthening therex.    Baseline 100 deg   Time 2   Period Weeks   Status Partially Met           PT Long Term Goals - 02/04/17 1852      PT LONG TERM GOAL #1   Title Pt will demo consistency and independence with his  advanced HEP to prepare him for discharge home.    Time 4   Period Weeks   Status New     PT LONG TERM GOAL #2   Title Pt will demo improved Lt shoulder AROM to atleast 140 deg flexion and 120 deg abduction to allow him to complete daily tasks such as combing and washing his hair.   Baseline flexion achieved, abduction achieved in scapular plane only    Time 4   Period Weeks   Status Achieved     PT LONG TERM GOAL #3   Title Pt will demo improved Lt shoulder strength to 5/5 MMT, which will assist with lifting small objects and placing them above shoulder height.    Time 4   Period Weeks   Status On-going     PT LONG TERM GOAL #4   Title Pt will report atleast 50% improvement in his LUE function, which will allow him to complete daily activites without assistance.    Time 4   Period Weeks   Status On-going               Plan - 02/04/17 1847    Rehab Potential Good   PT Frequency Other (comment)   PT Duration 4 weeks   PT Treatment/Interventions Cryotherapy;Electrical Stimulation;Moist Heat;Therapeutic exercise;Therapeutic activities;Neuromuscular re-education;Patient/family education;Manual techniques;Dry needling;Passive range of motion;Taping   PT Next Visit Plan dry needling Lt shoulder; progression of shoulder ROM, glenohumeral mobility, modalities as needed to decrease pain; lower trap and RTC activation   PT Home Exercise Plan isometrics 10x10", shoulder flexion/abduction/IR stretch, shoulder ext stretch    Consulted and Agree with Plan of Care Patient     Clinical impression: Pt is making steady progress towards his goals, demonstrating an increase in Lt shoulder passive and active ROM, up to 137 deg abduction and 40 deg external rotation. He continues to complete his exercises at home without difficulty and is reporting improved awareness of his pain levels during his stretches and other exercises. Although improving, he does have remaining limitations in shoulder  strength and ROM into external rotation specifically. Will consider dry needling at pt's next visit to further address muscle spasm and tightness around the shoulder girdle.   Patient will benefit from skilled therapeutic intervention in order to improve the following deficits and impairments:  Decreased activity tolerance, Decreased range of motion, Decreased  strength, Hypomobility, Impaired UE functional use, Impaired flexibility, Pain  Visit Diagnosis: Stiffness of left shoulder, not elsewhere classified  Acute pain of left shoulder  Muscle weakness (generalized)     Problem List Patient Active Problem List   Diagnosis Date Noted  . Elevated PSA 02/17/2016  . Dyslipidemia 02/17/2016  . Diabetes mellitus type 2, uncontrolled ([place]) 02/15/2016  . Well adult exam 02/15/2016  . History of colonic polyps 02/15/2016  . RBBB 02/15/2016  . Essential hypertension 02/09/2015  . Right leg DVT ([place]) 10/30/2014  . Gout 10/30/2014  . Stye external 10/30/2014  . Leg pain, right 10/29/2014  . ACHILLES TENDINITIS 01/12/2008  . SPRAIN AND STRAIN OF CALCANEOFIBULAR 01/12/2008    6:53 PM,02/04/17 Elly Modena PT, DPT [place] [facility] [address], [place], [place], [address] Phone: [phone]   Fax:  [phone]  Name: Halen Antenucci MRN: [id number] Date of Birth: [birth year]

## 2017-02-07 ENCOUNTER — Ambulatory Visit: Payer: PRIVATE HEALTH INSURANCE | Attending: Orthopedic Surgery | Admitting: Physical Therapy

## 2017-02-07 ENCOUNTER — Encounter: Payer: Self-pay | Admitting: Physical Therapy

## 2017-02-07 DIAGNOSIS — M6281 Muscle weakness (generalized): Secondary | ICD-10-CM | POA: Diagnosis present

## 2017-02-07 DIAGNOSIS — M25512 Pain in left shoulder: Secondary | ICD-10-CM | POA: Diagnosis present

## 2017-02-07 DIAGNOSIS — M25612 Stiffness of left shoulder, not elsewhere classified: Secondary | ICD-10-CM | POA: Diagnosis not present

## 2017-02-07 NOTE — Patient Instructions (Signed)

## 2017-02-07 NOTE — Therapy (Signed)
[facility] [address], [place], [place], [address] Phone: [phone]   Fax:  [phone]  Physical Therapy Treatment  Patient Details  Name: [name] MRN: [id number] Date of Birth: [birth year] Referring Provider: Marchia Bond, MD  Encounter Date: 02/07/2017      PT End of Session - 02/07/17 1340    Visit Number 6   Date for PT Re-Evaluation 02/25/17   Authorization Type Med[employer]   Authorization Time Period 01/28/17 to 02/25/17   Authorization - Visit Number 6   Authorization - Number of Visits 30   PT Start Time 7741   PT Stop Time 1320   PT Time Calculation (min) 50 min   Activity Tolerance Patient tolerated treatment well   Behavior During Therapy [facility] for tasks assessed/performed      Past Medical History:  Diagnosis Date  . Achilles tendonitis   . Arthritis    lower back  . Diabetes ([place])   . H/O hiatal hernia   . Hypertension   . Lower back pain   . Sprain and strain of calcaneofibular ligament     Past Surgical History:  Procedure Laterality Date  . COLONOSCOPY N/A 07/14/2013   Procedure: COLONOSCOPY;  Surgeon: Garlan Fair, MD;  Location: [facility];  Service: Endoscopy;  Laterality: N/A;    There were no vitals filed for this visit.      Subjective Assessment - 02/07/17 1240    Subjective Today I feel more sore and stiff. I have taken tylenol and motrin to loosen it up.    Pertinent History pronounced "Ah-Gee"   Limitations Lifting   How long can you sit comfortably? unlimited   How long can you stand comfortably? unlimited   How long can you walk comfortably? unlimited   Patient Stated Goals improve shoulder ROM and strength    Currently in Pain? Yes   Pain Score 3    Pain Location Shoulder   Pain Orientation Left;Anterior   Pain Descriptors / Indicators Aching   Pain Type Acute pain   Pain Onset 1 to 4 weeks ago   Pain Frequency Intermittent   Aggravating  Factors  stretching and elevating his arm   Pain Relieving Factors arm at side, resting, medication   Multiple Pain Sites No                         [facility] Adult PT Treatment/Exercise - 02/07/17 0001      Shoulder Exercises: Supine   External Rotation AROM;Both;5 reps  with hands behind head working ROM and scapula   Other Supine Exercises horizontal left shoulder adduction 10 times with end range stretch     Shoulder Exercises: Pulleys   Flexion 2 minutes   ABduction 2 minutes   ABduction Limitations scapular plane      Manual Therapy   Manual Therapy Joint mobilization;Soft tissue mobilization;Passive ROM   Joint Mobilization grade 3 joint mobilzation to left glenohumeral joint for inferior, anterior, posterior and lateral glide; left shoulder joint distraction; A-C joint mobilization; left scapula mobilization; P-A mobilization to T3-T8   Soft tissue mobilization left shoulder girdle, left A-C joint, left pectoralis, left RTC muscles, left deltoid, left rhomboids, posterior left shoulder   Passive ROM left shoulder for flexion, abduction, IR in sidely, ER , extension flexion and abduction          Trigger Point Dry Needling - 02/07/17 1331    Consent Given? Yes  Education Handout Provided Yes   Muscles Treated Upper Body Infraspinatus;Subscapularis  teres minor, triceps   Infraspinatus Response Twitch response elicited;Palpable increased muscle length   Subscapularis Response Twitch response elicited;Palpable increased muscle length              PT Education - 02/07/17 1331    Education provided Yes   Education Details information on dry needling   Person(s) Educated Patient   Methods Explanation;Demonstration;Verbal cues;Handout   Comprehension Returned demonstration;Verbalized understanding          PT Short Term Goals - 02/04/17 1851      PT SHORT TERM GOAL #1   Title Pt will demo consistency and independence with his initial HEP to  improve shoulder ROM and strength.    Time 2   Period Weeks   Status Achieved     PT SHORT TERM GOAL #2   Title Pt will demo improve shoulder abduction PROM to atleast 110 deg, which will assist with his ability to complete shoulder strengthening therex.    Baseline 100 deg   Time 2   Period Weeks   Status Partially Met           PT Long Term Goals - 02/04/17 1852      PT LONG TERM GOAL #1   Title Pt will demo consistency and independence with his advanced HEP to prepare him for discharge home.    Time 4   Period Weeks   Status New     PT LONG TERM GOAL #2   Title Pt will demo improved Lt shoulder AROM to atleast 140 deg flexion and 120 deg abduction to allow him to complete daily tasks such as combing and washing his hair.   Baseline flexion achieved, abduction achieved in scapular plane only    Time 4   Period Weeks   Status Achieved     PT LONG TERM GOAL #3   Title Pt will demo improved Lt shoulder strength to 5/5 MMT, which will assist with lifting small objects and placing them above shoulder height.    Time 4   Period Weeks   Status On-going     PT LONG TERM GOAL #4   Title Pt will report atleast 50% improvement in his LUE function, which will allow him to complete daily activites without assistance.    Time 4   Period Weeks   Status On-going               Plan - 02/07/17 1341    Clinical Impression Statement Patient will compenstate with contracting his left upper trap with shoulder motion.  Patient has tightness in left shoulder joint.  Patient has thickness in anterior left shoulder and A-C joint.  Patient has improved left shoulder PROM after therapy.  Patient was able to abduct his left shoulder to 90 degrees with no pain and no difficulty.  Patient had trigger point release with dry needling in the subscapularis under the shoulder blade, infraspinatus and triceps.  Patient will benefit from aggressive left shoulder joint mobilization and deep soft  tissue work while strengthening the scapula and shoulder.     Rehab Potential Good   Clinical Impairments Affecting Rehab Potential Patient had a left shoulder manipulation   PT Frequency 2x / week   PT Duration 2 weeks   PT Treatment/Interventions Cryotherapy;Electrical Stimulation;Moist Heat;Therapeutic exercise;Therapeutic activities;Neuromuscular re-education;Patient/family education;Manual techniques;Dry needling;Passive range of motion;Taping   PT Next Visit Plan dry needling Lt shoulder; progression of shoulder ROM, glenohumeral  mobility, modalities as needed to decrease pain; lower trap and RTC activation   PT Home Exercise Plan scapula strength with row, retraction, wall walking   Recommended Other Services MD signed initial evaluation   Consulted and Agree with Plan of Care Patient      Patient will benefit from skilled therapeutic intervention in order to improve the following deficits and impairments:  Decreased activity tolerance, Decreased range of motion, Decreased strength, Hypomobility, Impaired UE functional use, Impaired flexibility, Pain  Visit Diagnosis: Stiffness of left shoulder, not elsewhere classified  Acute pain of left shoulder  Muscle weakness (generalized)     Problem List Patient Active Problem List   Diagnosis Date Noted  . Elevated PSA 02/17/2016  . Dyslipidemia 02/17/2016  . Diabetes mellitus type 2, uncontrolled ([place]) 02/15/2016  . Well adult exam 02/15/2016  . History of colonic polyps 02/15/2016  . RBBB 02/15/2016  . Essential hypertension 02/09/2015  . Right leg DVT ([place]) 10/30/2014  . Gout 10/30/2014  . Stye external 10/30/2014  . Leg pain, right 10/29/2014  . ACHILLES TENDINITIS 01/12/2008  . SPRAIN AND STRAIN OF CALCANEOFIBULAR 01/12/2008    Earlie Counts, PT 02/07/17 1:46 PM   [facility] [address] [place], [place], [address] Phone: [phone]   Fax:   [phone]  Name: [name] MRN: [id number] Date of Birth: March 19, 1961

## 2017-02-12 ENCOUNTER — Ambulatory Visit: Payer: PRIVATE HEALTH INSURANCE | Admitting: Physical Therapy

## 2017-02-12 ENCOUNTER — Encounter: Payer: Self-pay | Admitting: Physical Therapy

## 2017-02-12 DIAGNOSIS — M6281 Muscle weakness (generalized): Secondary | ICD-10-CM

## 2017-02-12 DIAGNOSIS — M25612 Stiffness of left shoulder, not elsewhere classified: Secondary | ICD-10-CM

## 2017-02-12 DIAGNOSIS — M25512 Pain in left shoulder: Secondary | ICD-10-CM

## 2017-02-12 NOTE — Therapy (Signed)
[facility] [address], [place], [place], [address] Phone: [phone]   Fax:  [phone]  Physical Therapy Treatment  Patient Details  Name: [name] MRN: [id number] Date of Birth: 1960/05/01 Referring Provider: Marchia Bond, MD   Encounter Date: 02/12/2017  PT End of Session - 02/12/17 0347    Visit Number  7    Number of Visits  11    Date for PT Re-Evaluation  02/25/17    Authorization Type  Medcost/Tricare    Authorization Time Period  01/28/17 to 02/25/17    Authorization - Visit Number  7    Authorization - Number of Visits  30    PT Start Time  1400    PT Stop Time  1440    PT Time Calculation (min)  40 min    Activity Tolerance  Patient tolerated treatment well    Behavior During Therapy  [facility] for tasks assessed/performed       Past Medical History:  Diagnosis Date  . Achilles tendonitis   . Arthritis    lower back  . Diabetes ([place])   . H/O hiatal hernia   . Hypertension   . Lower back pain   . Sprain and strain of calcaneofibular ligament     History reviewed. No pertinent surgical history.  There were no vitals filed for this visit.  Subjective Assessment - 02/12/17 1406    Subjective  I had a bout of diverticulitis over the weekend.  I was not able to do my exercises at well.     Pertinent History  pronounced "Ah-Gee"    Limitations  Lifting    How long can you sit comfortably?  unlimited    How long can you stand comfortably?  unlimited    How long can you walk comfortably?  unlimited    Patient Stated Goals  improve shoulder ROM and strength     Currently in Pain?  Yes    Pain Score  2     Pain Location  Shoulder    Pain Orientation  Left    Pain Descriptors / Indicators  Aching    Pain Type  Acute pain    Pain Radiating Towards  none    Pain Onset  1 to 4 weeks ago    Pain Frequency  Intermittent    Aggravating Factors   stretching and elevating his arm    Pain Relieving  Factors  arm at side, resting, medication    Multiple Pain Sites  No         [facility] PT Assessment - 02/12/17 0001      Assessment   Medical Diagnosis  x      AROM   Left Shoulder Flexion  130 Degrees after 138   after 138   Left Shoulder ABduction  128 Degrees 150 with scaption   150 with scaption     PROM   Left Shoulder Extension  35 Degrees    Left Shoulder Flexion  170 Degrees    Left Shoulder ABduction  160 Degrees    Left Shoulder Internal Rotation  -- reach to L3   reach to L3                 [facility] Adult PT Treatment/Exercise - 02/12/17 0001      Shoulder Exercises: Supine   Other Supine Exercises  left shoulder PNF across body with AAROM       Shoulder Exercises: ROM/Strengthening  UBE (Upper Arm Bike)  level 1 4 min      Modalities   Modalities  Cryotherapy      Cryotherapy   Number Minutes Cryotherapy  10 Minutes    Cryotherapy Location  Shoulder left   left   Type of Cryotherapy  Ice pack      Manual Therapy   Manual Therapy  Joint mobilization;Soft tissue mobilization;Passive ROM    Manual therapy comments  sidely left shoulder extension and bring left hand to L3    Joint Mobilization  grade 3 joint mobilzation to left glenohumeral joint for inferior, anterior, posterior and lateral glide; left shoulder joint distraction; A-C joint mobilization; left scapula mobilization;    Soft tissue mobilization  left shoulder girdle, left A-C joint, left pectoralis, left RTC muscles, left deltoid,     Passive ROM  left shoulder for flexion, abduction, IR in sidely, ER , extension flexion and abduction       Trigger Point Dry Needling - 02/12/17 1532    Consent Given?  Yes    Muscles Treated Upper Body  Pectoralis major;Pectoralis minor anterior deltoid on left   anterior deltoid on left   Pectoralis Major Response  Twitch response elicited;Palpable increased muscle length    Pectoralis Minor Response  Twitch response elicited;Palpable increased  muscle length             PT Short Term Goals - 02/12/17 1532      PT SHORT TERM GOAL #2   Title  Pt will demo improve shoulder abduction PROM to atleast 110 deg, which will assist with his ability to complete shoulder strengthening therex.     Time  2    Period  Weeks    Status  Achieved        PT Long Term Goals - 02/04/17 1852      PT LONG TERM GOAL #1   Title  Pt will demo consistency and independence with his advanced HEP to prepare him for discharge home.     Time  4    Period  Weeks    Status  New      PT LONG TERM GOAL #2   Title  Pt will demo improved Lt shoulder AROM to atleast 140 deg flexion and 120 deg abduction to allow him to complete daily tasks such as combing and washing his hair.    Baseline  flexion achieved, abduction achieved in scapular plane only     Time  4    Period  Weeks    Status  Achieved      PT LONG TERM GOAL #3   Title  Pt will demo improved Lt shoulder strength to 5/5 MMT, which will assist with lifting small objects and placing them above shoulder height.     Time  4    Period  Weeks    Status  On-going      PT LONG TERM GOAL #4   Title  Pt will report atleast 50% improvement in his LUE function, which will allow him to complete daily activites without assistance.     Time  4    Period  Weeks    Status  On-going            Plan - 02/12/17 1529    Clinical Impression Statement  Patient has increased ROM of his left shoulder from last visit.  Patient has increased pain with using his left arm from mid range to overhead.  Patient is now  able to bring his left hand to L3 with pain.  Patient has tightness in anterior left shoulder joint.  Patient has bruising on left elbow that was there last visit.  Patient will benefit from skilled therapy to improve left shoulder ROM and strength.     Rehab Potential  Good    Clinical Impairments Affecting Rehab Potential  Patient had a left shoulder manipulation    PT Frequency  2x / week     PT Duration  4 weeks    PT Treatment/Interventions  Cryotherapy;Electrical Stimulation;Moist Heat;Therapeutic exercise;Therapeutic activities;Neuromuscular re-education;Patient/family education;Manual techniques;Dry needling;Passive range of motion;Taping    PT Next Visit Plan  dry needling Lt shoulder; progression of shoulder ROM, glenohumeral mobility, modalities as needed to decrease pain; lower trap and RTC activation    PT Home Exercise Plan  scapula strength with row, retraction, wall walking    Consulted and Agree with Plan of Care  Patient       Patient will benefit from skilled therapeutic intervention in order to improve the following deficits and impairments:  Decreased activity tolerance, Decreased range of motion, Decreased strength, Hypomobility, Impaired UE functional use, Impaired flexibility, Pain  Visit Diagnosis: Stiffness of left shoulder, not elsewhere classified  Acute pain of left shoulder  Muscle weakness (generalized)     Problem List Patient Active Problem List   Diagnosis Date Noted  . Elevated PSA 02/17/2016  . Dyslipidemia 02/17/2016  . Diabetes mellitus type 2, uncontrolled ([place]) 02/15/2016  . Well adult exam 02/15/2016  . History of colonic polyps 02/15/2016  . RBBB 02/15/2016  . Essential hypertension 02/09/2015  . Right leg DVT ([place]) 10/30/2014  . Gout 10/30/2014  . Stye external 10/30/2014  . Leg pain, right 10/29/2014  . ACHILLES TENDINITIS 01/12/2008  . SPRAIN AND STRAIN OF CALCANEOFIBULAR 01/12/2008    Earlie Counts, PT 02/12/17 3:34 PM   [facility] [address] [place], [place], [address] Phone: [phone]   Fax:  [phone]  Name: Doctor Sheahan MRN: [id number] Date of Birth: 06-05-1960

## 2017-02-13 ENCOUNTER — Encounter: Payer: Self-pay | Admitting: Physical Therapy

## 2017-02-13 ENCOUNTER — Ambulatory Visit: Payer: PRIVATE HEALTH INSURANCE | Admitting: Physical Therapy

## 2017-02-13 ENCOUNTER — Other Ambulatory Visit: Payer: Self-pay

## 2017-02-13 DIAGNOSIS — M25512 Pain in left shoulder: Secondary | ICD-10-CM

## 2017-02-13 DIAGNOSIS — M25612 Stiffness of left shoulder, not elsewhere classified: Secondary | ICD-10-CM | POA: Diagnosis not present

## 2017-02-13 DIAGNOSIS — M6281 Muscle weakness (generalized): Secondary | ICD-10-CM

## 2017-02-13 NOTE — Therapy (Signed)
[facility] [address], [place], [place], [address] Phone: [phone]   Fax:  [phone]  Physical Therapy Treatment  Patient Details  Name: [name] MRN: [id number] Date of Birth: Mar 28, 1961 Referring Provider: Marchia Bond, MD   Encounter Date: 02/13/2017  PT End of Session - 02/13/17 1445    Visit Number  8    Number of Visits  11    Date for PT Re-Evaluation  02/25/17    Authorization Type  Medcost/Tricare    Authorization - Visit Number  8    Authorization - Number of Visits  30    PT Start Time  1400    PT Stop Time  1500    PT Time Calculation (min)  60 min    Activity Tolerance  Patient tolerated treatment well    Behavior During Therapy  [facility] for tasks assessed/performed       Past Medical History:  Diagnosis Date  . Achilles tendonitis   . Arthritis    lower back  . Diabetes ([place])   . H/O hiatal hernia   . Hypertension   . Lower back pain   . Sprain and strain of calcaneofibular ligament     History reviewed. No pertinent surgical history.  There were no vitals filed for this visit.  Subjective Assessment - 02/13/17 1404    Subjective  My shoulder woke me up.  I have more movement.     Pertinent History  pronounced "Ah-Gee"    Limitations  Lifting    How long can you sit comfortably?  unlimited    How long can you walk comfortably?  unlimited    Patient Stated Goals  improve shoulder ROM and strength     Currently in Pain?  Yes    Pain Score  2     Pain Location  Shoulder    Pain Orientation  Left    Pain Descriptors / Indicators  Aching    Pain Type  Acute pain    Pain Onset  1 to 4 weeks ago    Pain Frequency  Intermittent    Aggravating Factors   strehcning and elevation hi sarm    Pain Relieving Factors  arm at side, resting, medication    Effect of Pain on Daily Activities  moderate limitation    Multiple Pain Sites  No                      [facility] Adult  PT Treatment/Exercise - 02/13/17 0001      Shoulder Exercises: ROM/Strengthening   UBE (Upper Arm Bike)  level 1 2 forward min/2 min backward      Modalities   Modalities  Cryotherapy      Cryotherapy   Number Minutes Cryotherapy  15 Minutes    Cryotherapy Location  Shoulder left   left   Type of Cryotherapy  Ice pack      Manual Therapy   Manual Therapy  Joint mobilization;Soft tissue mobilization;Passive ROM    Joint Mobilization  grade 3 joint mobilzation to left glenohumeral joint for inferior, anterior, posterior and lateral glide; left shoulder joint distraction    Soft tissue mobilization  left upper trap, left rhomboids, left RTC muscles    Passive ROM  left shoulder for flexion, abduction, IR in sidely, ER , extension flexion and abduction       Trigger Point Dry Needling - 02/13/17 1454    Consent Given?  Yes  Muscles Treated Upper Body  Upper trapezius;Subscapularis;Rhomboids    Upper Trapezius Response  Palpable increased muscle length;Twitch reponse elicited    Rhomboids Response  Twitch response elicited;Palpable increased muscle length    Subscapularis Response  Twitch response elicited;Palpable increased muscle length           PT Education - 02/13/17 1443    Education provided  Yes    Education Details  scapula strength and ROM exercises    Person(s) Educated  Patient    Methods  Explanation;Demonstration;Verbal cues;Handout    Comprehension  Verbalized understanding;Returned demonstration       PT Short Term Goals - 02/12/17 1532      PT SHORT TERM GOAL #2   Title  Pt will demo improve shoulder abduction PROM to atleast 110 deg, which will assist with his ability to complete shoulder strengthening therex.     Time  2    Period  Weeks    Status  Achieved        PT Long Term Goals - 02/04/17 1852      PT LONG TERM GOAL #1   Title  Pt will demo consistency and independence with his advanced HEP to prepare him for discharge home.     Time  4     Period  Weeks    Status  New      PT LONG TERM GOAL #2   Title  Pt will demo improved Lt shoulder AROM to atleast 140 deg flexion and 120 deg abduction to allow him to complete daily tasks such as combing and washing his hair.    Baseline  flexion achieved, abduction achieved in scapular plane only     Time  4    Period  Weeks    Status  Achieved      PT LONG TERM GOAL #3   Title  Pt will demo improved Lt shoulder strength to 5/5 MMT, which will assist with lifting small objects and placing them above shoulder height.     Time  4    Period  Weeks    Status  On-going      PT LONG TERM GOAL #4   Title  Pt will report atleast 50% improvement in his LUE function, which will allow him to complete daily activites without assistance.     Time  4    Period  Weeks    Status  On-going            Plan - 02/13/17 1405    Clinical Impression Statement  Patient continues to have increased left shoulder ROM.  Patient has tightness in left shoulder IR.  Patient has increased tenderness located in anterior left shoulder and reported he may have tendonits in his left shoulder.  Patient is continuing with his HEP to maintain and increase his shoulder ROM.  Patient will benefit from skilled therapy to improve left shoulder ROM and strength.     Rehab Potential  Good    Clinical Impairments Affecting Rehab Potential  Patient had a left shoulder manipulation    PT Frequency  2x / week    PT Duration  4 weeks    PT Treatment/Interventions  Cryotherapy;Electrical Stimulation;Moist Heat;Therapeutic exercise;Therapeutic activities;Neuromuscular re-education;Patient/family education;Manual techniques;Dry needling;Passive range of motion;Taping    PT Next Visit Plan  when write renewal add iontophoresis; closed chain exercises; PNF pattern    PT Home Exercise Plan  progress as needed    Consulted and Agree with Plan of Care  Patient       Patient will benefit from skilled therapeutic intervention in  order to improve the following deficits and impairments:  Decreased activity tolerance, Decreased range of motion, Decreased strength, Hypomobility, Impaired UE functional use, Impaired flexibility, Pain  Visit Diagnosis: Stiffness of left shoulder, not elsewhere classified  Acute pain of left shoulder  Muscle weakness (generalized)     Problem List Patient Active Problem List   Diagnosis Date Noted  . Elevated PSA 02/17/2016  . Dyslipidemia 02/17/2016  . Diabetes mellitus type 2, uncontrolled ([place]) 02/15/2016  . Well adult exam 02/15/2016  . History of colonic polyps 02/15/2016  . RBBB 02/15/2016  . Essential hypertension 02/09/2015  . Right leg DVT ([place]) 10/30/2014  . Gout 10/30/2014  . Stye external 10/30/2014  . Leg pain, right 10/29/2014  . ACHILLES TENDINITIS 01/12/2008  . SPRAIN AND STRAIN OF CALCANEOFIBULAR 01/12/2008    Earlie Counts, PT 02/13/17 3:08 PM   [facility] [address], [place], [place], [address] Phone: [phone]   Fax:  [phone]  Name: Abdulai Blaylock MRN: [id number] Date of Birth: Nov 02, 1960

## 2017-02-13 NOTE — Patient Instructions (Addendum)
EXTENSION: Standing - Resistance Band: Stable (Active)   Stand, right arm at side. Against yellow resistance band, draw arm backward, as far as possible, keeping elbow straight. Red band.  Complete _2__ sets of _10__ repetitions. Perform _1__ sessions per day.  Copyright  VHI. All rights reserved.     Copyright  VHI. All rights reserved.  Resistive Band Rowing   With resistive band anchored in door, grasp both ends. Keeping elbows bent, pull back, squeezing shoulder blades together. Hold _1___ seconds. Repeat __2x10__ times. Do _1 time per day_  Resisted External Rotation: in Neutral - Bilateral    Sit or stand, tubing in both hands, elbows at sides, bent to 90, forearms forward. Pinch shoulder blades together and rotate forearms out. Keep elbows at sides. Yellow band Repeat __10__ times per set. Do __2__ sets per session. Do __1__ sessions per day.  http://orth.exer.us/966    Stand facing wall, walk fingers up the wall until you feel a good stretch. Hold for 10 seconds. Slowly walk your fingers back down the wall. 15 times 1 time per day    Using your affected arm, walk your hand up a wall straight out to the side, as high as you are able. Do this exercise in a relatively pain-free range of motion at your shoulder/arm. 10 times  2 times per day    Standing at a wall, place your arms out in front of you with your elbows Sable Feil so that your hands just reach the wall.  Next, bend your elbows slowly to bring your chest closer to the wall. Maintain your feet planted on the ground the entire time. 15 times  [facility] [address], [place],  [address] Phone # [phone] Fax [phone]

## 2017-02-18 ENCOUNTER — Ambulatory Visit: Payer: PRIVATE HEALTH INSURANCE | Admitting: Physical Therapy

## 2017-02-18 ENCOUNTER — Encounter: Payer: Self-pay | Admitting: Physical Therapy

## 2017-02-18 DIAGNOSIS — M25612 Stiffness of left shoulder, not elsewhere classified: Secondary | ICD-10-CM

## 2017-02-18 DIAGNOSIS — M25512 Pain in left shoulder: Secondary | ICD-10-CM

## 2017-02-18 NOTE — Therapy (Signed)
[facility] [address] [place], [place], [address] Phone: [phone]   Fax:  [phone]  Physical Therapy Treatment  Patient Details  Name: [name] MRN: [id number] Date of Birth: [birth year] Referring Provider: Dr. Marchia Bond   Encounter Date: 02/18/2017  PT End of Session - 02/18/17 1446    Visit Number  9    Date for PT Re-Evaluation  03/25/17    Authorization Type  Medcost/Tricare    Authorization - Visit Number  9    Authorization - Number of Visits  30    PT Start Time  1400    PT Stop Time  1500    PT Time Calculation (min)  60 min    Activity Tolerance  Patient tolerated treatment well    Behavior During Therapy  [facility] for tasks assessed/performed       Past Medical History:  Diagnosis Date  . Achilles tendonitis   . Arthritis    lower back  . Diabetes ([place])   . H/O hiatal hernia   . Hypertension   . Lower back pain   . Sprain and strain of calcaneofibular ligament     History reviewed. No pertinent surgical history.  There were no vitals filed for this visit.  Subjective Assessment - 02/18/17 1405    Subjective  My shoulder is getting better. I have more motion in my shoulder.     Pertinent History  pronounced "Ah-Gee"    Limitations  Lifting    How long can you sit comfortably?  unlimited    How long can you stand comfortably?  unlimited    How long can you walk comfortably?  unlimited    Patient Stated Goals  improve shoulder ROM and strength     Currently in Pain?  Yes    Pain Score  1     Pain Location  Shoulder    Pain Orientation  Left    Pain Descriptors / Indicators  Aching    Pain Type  Acute pain    Pain Onset  1 to 4 weeks ago    Pain Frequency  Intermittent    Aggravating Factors   stretching overhead    Pain Relieving Factors  propping left arm up         [facility] PT Assessment - 02/18/17 0001      Assessment   Medical Diagnosis  s/p arthroscopic of left  shoulder    Referring Provider  Dr. Marchia Bond    Onset Date/Surgical Date  01/24/17    Hand Dominance  Right    Prior Therapy  none       Precautions   Precautions  None      Restrictions   Weight Bearing Restrictions  No      Balance Screen   Has the patient fallen in the past 6 months  No    Has the patient had a decrease in activity level because of a fear of falling?   No    Is the patient reluctant to leave their home because of a fear of falling?   No      Home Film/video editor residence      Prior Function   Level of Independence  Independent    Vocation Requirements  builder      Cognition   Overall Cognitive Status  Within Functional Limits for tasks assessed      Observation/Other Assessments   Focus on  Therapeutic Outcomes (FOTO)   46% limited      AROM   Left Shoulder Extension  55 Degrees    Left Shoulder Flexion  135 Degrees    Left Shoulder ABduction  135 Degrees scaption, not have to catch his arm when coming down    Left Shoulder Internal Rotation  -- reach to sacrum    Left Shoulder External Rotation  55 Degrees 90 degrees abduction      PROM   Left Shoulder Extension  55 Degrees    Left Shoulder Flexion  170 Degrees    Left Shoulder ABduction  170 Degrees    Left Shoulder Internal Rotation  -- on pulleys to L1    Left Shoulder External Rotation  75 Degrees      Strength   Left Shoulder Flexion  3/5    Left Shoulder ABduction  3/5    Left Shoulder Internal Rotation  3+/5    Left Shoulder External Rotation  3+/5                  [facility] Adult PT Treatment/Exercise - 02/18/17 0001      Shoulder Exercises: Supine   Flexion  Strengthening;Both;10 reps;Weights    Shoulder Flexion Weight (lbs)  4#    Flexion Limitations  using a cane    Other Supine Exercises  cane with 4# moving side to side with stretch;     Other Supine Exercises  left arm at 90 degrees flexion CC/CW,       Shoulder Exercises: Seated    External Rotation  10 reps;Left;Strengthening hold 5 sec    Other Seated Exercises  upright row with 4# 10 times      Shoulder Exercises: Standing   Extension  Strengthening;Both;10 reps using a cane      Shoulder Exercises: Pulleys   Other Pulley Exercises  IR with pulleys      Shoulder Exercises: ROM/Strengthening   UBE (Upper Arm Bike)  level 2 2 forward min/2 min backward      Modalities   Modalities  Cryotherapy      Cryotherapy   Number Minutes Cryotherapy  15 Minutes    Cryotherapy Location  Shoulder left    Type of Cryotherapy  Ice pack      Manual Therapy   Manual Therapy  Joint mobilization    Joint Mobilization  grade 3 mobilization to left shoulder for distraction, inferior glide, anterior glide               PT Short Term Goals - 02/12/17 1532      PT SHORT TERM GOAL #2   Title  Pt will demo improve shoulder abduction PROM to atleast 110 deg, which will assist with his ability to complete shoulder strengthening therex.     Time  2    Period  Weeks    Status  Achieved        PT Long Term Goals - 02/04/17 1852      PT LONG TERM GOAL #1   Title  Pt will demo consistency and independence with his advanced HEP to prepare him for discharge home.     Time  4    Period  Weeks    Status  New      PT LONG TERM GOAL #2   Title  Pt will demo improved Lt shoulder AROM to atleast 140 deg flexion and 120 deg abduction to allow him to complete daily tasks such as combing and washing his  hair.    Baseline  flexion achieved, abduction achieved in scapular plane only     Time  4    Period  Weeks    Status  Achieved      PT LONG TERM GOAL #3   Title  Pt will demo improved Lt shoulder strength to 5/5 MMT, which will assist with lifting small objects and placing them above shoulder height.     Time  4    Period  Weeks    Status  On-going      PT LONG TERM GOAL #4   Title  Pt will report atleast 50% improvement in his LUE function, which will allow him to  complete daily activites without assistance.     Time  4    Period  Weeks    Status  On-going            Plan - 02/18/17 1447    Clinical Impression Statement  Patient has increased in left shoulder AROM and PROM by 30 degrees.  Patient has increased strength so he is now able to reach overhead, 3+/5.  Patient has improved muscle mobility and scapula mobility since initial evaluation. Patient is working hard at home with pulleys and strengthening. Patient has a pain in the joint anteriorly with circular motion. Patient will contract the upper trapezius with overhead movements  and assistance with left scapula movement.  Patient is having pain in left arm with overhead movement. Patient will benefit from skilled therapy to improve left shoulder ROM and strength while reducing pain to restore function.      Rehab Potential  Good    Clinical Impairments Affecting Rehab Potential  Patient had a left shoulder manipulation    PT Frequency  2x / week    PT Duration  4 weeks    PT Treatment/Interventions  Cryotherapy;Electrical Stimulation;Moist Heat;Therapeutic exercise;Therapeutic activities;Neuromuscular re-education;Patient/family education;Manual techniques;Dry needling;Passive range of motion;Taping;Iontophoresis 4mg /ml Dexamethasone    PT Next Visit Plan   closed chain exercises; PNF pattern; joint mobilization to left shoulder with ROM; exercises that work on strength and  ROM; reach into cabinets    PT Home Exercise Plan  progress as needed    Recommended Other Services  sent MD renewal to add ionto       Patient will benefit from skilled therapeutic intervention in order to improve the following deficits and impairments:  Decreased activity tolerance, Decreased range of motion, Decreased strength, Hypomobility, Impaired UE functional use, Impaired flexibility, Pain  Visit Diagnosis: Stiffness of left shoulder, not elsewhere classified - Plan: PT plan of care cert/re-cert  Acute pain  of left shoulder - Plan: PT plan of care cert/re-cert     Problem List Patient Active Problem List   Diagnosis Date Noted  . Elevated PSA 02/17/2016  . Dyslipidemia 02/17/2016  . Diabetes mellitus type 2, uncontrolled ([place]) 02/15/2016  . Well adult exam 02/15/2016  . History of colonic polyps 02/15/2016  . RBBB 02/15/2016  . Essential hypertension 02/09/2015  . Right leg DVT ([place]) 10/30/2014  . Gout 10/30/2014  . Stye external 10/30/2014  . Leg pain, right 10/29/2014  . ACHILLES TENDINITIS 01/12/2008  . SPRAIN AND STRAIN OF CALCANEOFIBULAR 01/12/2008    Earlie Counts, PT 02/18/17 3:31 PM    [facility] [address], [place], [place], [address] Phone: [phone]   Fax:  [phone]  Name: [name] MRN: [id number] Date of Birth: [birth year]

## 2017-02-21 ENCOUNTER — Ambulatory Visit: Payer: PRIVATE HEALTH INSURANCE | Admitting: Physical Therapy

## 2017-02-21 DIAGNOSIS — M25612 Stiffness of left shoulder, not elsewhere classified: Secondary | ICD-10-CM

## 2017-02-21 DIAGNOSIS — M6281 Muscle weakness (generalized): Secondary | ICD-10-CM

## 2017-02-21 DIAGNOSIS — M25512 Pain in left shoulder: Secondary | ICD-10-CM

## 2017-02-21 NOTE — Therapy (Signed)
[facility] [address], [place], [place], [address] Phone: [phone]   Fax:  [phone]  Physical Therapy Treatment  Patient Details  Name: [name] MRN: [id number] Date of Birth: 12/30/1960 Referring Provider: Dr. Marchia Bond   Encounter Date: 02/21/2017  PT End of Session - 02/21/17 1022    Visit Number  10    Date for PT Re-Evaluation  03/25/17    Authorization Type  Medcost/Tricare    Authorization - Visit Number  10    Authorization - Number of Visits  30    PT Start Time  2229    PT Stop Time  1106    PT Time Calculation (min)  48 min    Activity Tolerance  Patient tolerated treatment well;No increased pain    Behavior During Therapy  WFL for tasks assessed/performed       Past Medical History:  Diagnosis Date  . Achilles tendonitis   . Arthritis    lower back  . Diabetes ([place])   . H/O hiatal hernia   . Hypertension   . Lower back pain   . Sprain and strain of calcaneofibular ligament     Past Surgical History:  Procedure Laterality Date  . COLONOSCOPY N/A 07/14/2013   Procedure: COLONOSCOPY;  Surgeon: Garlan Fair, MD;  Location: [facility];  Service: Endoscopy;  Laterality: N/A;    There were no vitals filed for this visit.  Subjective Assessment - 02/21/17 1020    Subjective  Pt reports that things are going well. He woke up with a nose bleed this morning and almost had to cancel.     Pertinent History  pronounced "Ah-Gee"    Limitations  Lifting    How long can you sit comfortably?  unlimited    How long can you stand comfortably?  unlimited    How long can you walk comfortably?  unlimited    Patient Stated Goals  improve shoulder ROM and strength     Currently in Pain?  Yes    Pain Score  1     Pain Location  Shoulder    Pain Orientation  Left    Pain Descriptors / Indicators  Aching    Pain Type  Acute pain    Pain Radiating Towards  none    Pain Onset  More than a  month ago    Pain Frequency  Intermittent    Aggravating Factors   stretching overhead    Pain Relieving Factors  resting arm     Effect of Pain on Daily Activities  mild                       [facility] Adult PT Treatment/Exercise - 02/21/17 0001      Shoulder Exercises: Supine   Flexion  Strengthening;10 reps;Other (comment) x2 sets     Shoulder Flexion Weight (lbs)  4#    Flexion Limitations  using a cane    Other Supine Exercises  BUE chest press with slow eccentric lowering x15 reps, 4# weight with cane       Shoulder Exercises: Seated   External Rotation  Left;15 reps;Other (comment) 5 sec hold       Shoulder Exercises: Prone   Retraction  5 reps;Other (comment) discontinued due to increase in pain    Extension  10 reps;Left x3 sec hold     Other Prone Exercises  closed chain LUE press into physioball 10x5 sec hold  Shoulder Exercises: Standing   Extension  10 reps;Other (comment);Both;Strengthening x10 sec hold using a cane       Shoulder Exercises: ROM/Strengthening   UBE (Upper Arm Bike)  L2, 2' forward/2' backward      Modalities   Modalities  Iontophoresis      Iontophoresis   Type of Iontophoresis  Dexamethasone    Location  Lt anterior shoulder     Dose  4 mg/mL    Time  6 hour patch       Manual Therapy   Joint Mobilization  MWM during active assisted shoulder elevation 3x10 reps; grade III glenohumeral posterior and inferior glide             PT Education - 02/21/17 1341    Education provided  Yes    Education Details  side effects and wear time of ionto patch    Person(s) Educated  Patient    Methods  Explanation    Comprehension  Verbalized understanding       PT Short Term Goals - 02/12/17 1532      PT SHORT TERM GOAL #2   Title  Pt will demo improve shoulder abduction PROM to atleast 110 deg, which will assist with his ability to complete shoulder strengthening therex.     Time  2    Period  Weeks    Status  Achieved         PT Long Term Goals - 02/04/17 1852      PT LONG TERM GOAL #1   Title  Pt will demo consistency and independence with his advanced HEP to prepare him for discharge home.     Time  4    Period  Weeks    Status  New      PT LONG TERM GOAL #2   Title  Pt will demo improved Lt shoulder AROM to atleast 140 deg flexion and 120 deg abduction to allow him to complete daily tasks such as combing and washing his hair.    Baseline  flexion achieved, abduction achieved in scapular plane only     Time  4    Period  Weeks    Status  Achieved      PT LONG TERM GOAL #3   Title  Pt will demo improved Lt shoulder strength to 5/5 MMT, which will assist with lifting small objects and placing them above shoulder height.     Time  4    Period  Weeks    Status  On-going      PT LONG TERM GOAL #4   Title  Pt will report atleast 50% improvement in his LUE function, which will allow him to complete daily activites without assistance.     Time  4    Period  Weeks    Status  On-going            Plan - 02/21/17 1119    Clinical Impression Statement  Today's session continued with manual techniques and therex to improve pt's ROM and strength. Pt was able to complete AAROM into shoulder elevation, pain free during mobilization technique completed during session, and when completing active shoulder elevation, there was only ~ 5 deg improvement in pain onset. He does continue to demonstrate muscle fatigue and shaking with exercises completed, however there was no report of increase in pain following today's activities. Pt was educated on side effects of iontophoresis patch and this was applied to the anterior shoulder, with pt verbalized  understanding.     Rehab Potential  Good    Clinical Impairments Affecting Rehab Potential  Patient had a left shoulder manipulation    PT Frequency  2x / week    PT Duration  4 weeks    PT Treatment/Interventions  Cryotherapy;Electrical Stimulation;Moist  Heat;Therapeutic exercise;Therapeutic activities;Neuromuscular re-education;Patient/family education;Manual techniques;Dry needling;Passive range of motion;Taping;Iontophoresis 4mg /ml Dexamethasone    PT Next Visit Plan   f/u on ionto patch, closed chain exercises; PNF pattern; joint mobilization to left shoulder with ROM; exercises that work on strength and  ROM; reach into cabinets    PT Home Exercise Plan  progress as needed    Recommended Other Services  MD order signed     Consulted and Agree with Plan of Care  Patient       Patient will benefit from skilled therapeutic intervention in order to improve the following deficits and impairments:  Decreased activity tolerance, Decreased range of motion, Decreased strength, Hypomobility, Impaired UE functional use, Impaired flexibility, Pain  Visit Diagnosis: Stiffness of left shoulder, not elsewhere classified  Acute pain of left shoulder  Muscle weakness (generalized)     Problem List Patient Active Problem List   Diagnosis Date Noted  . Elevated PSA 02/17/2016  . Dyslipidemia 02/17/2016  . Diabetes mellitus type 2, uncontrolled ([place]) 02/15/2016  . Well adult exam 02/15/2016  . History of colonic polyps 02/15/2016  . RBBB 02/15/2016  . Essential hypertension 02/09/2015  . Right leg DVT ([place]) 10/30/2014  . Gout 10/30/2014  . Stye external 10/30/2014  . Leg pain, right 10/29/2014  . ACHILLES TENDINITIS 01/12/2008  . SPRAIN AND STRAIN OF CALCANEOFIBULAR 01/12/2008    1:49 PM,02/21/17 Elly Modena PT, DPT [place] [facility] [address], [place], [place], [address] Phone: [phone]   Fax:  [phone]  Name: [name] MRN: [id number] Date of Birth: 22-Jun-1960

## 2017-02-25 ENCOUNTER — Ambulatory Visit: Payer: PRIVATE HEALTH INSURANCE | Admitting: Physical Therapy

## 2017-02-25 DIAGNOSIS — M6281 Muscle weakness (generalized): Secondary | ICD-10-CM

## 2017-02-25 DIAGNOSIS — M25512 Pain in left shoulder: Secondary | ICD-10-CM

## 2017-02-25 DIAGNOSIS — M25612 Stiffness of left shoulder, not elsewhere classified: Secondary | ICD-10-CM

## 2017-02-25 NOTE — Therapy (Signed)
[facility] [address], [place], [place], [address] Phone: [phone]   Fax:  [phone]  Physical Therapy Treatment  Patient Details  Name: [name] MRN: [id number] Date of Birth: [birth year] Referring Provider: Dr. Marchia Bond   Encounter Date: 02/25/2017  PT End of Session - 02/25/17 1502    Visit Number  11    Date for PT Re-Evaluation  03/25/17    Authorization Type  Medcost/Tricare    Authorization - Visit Number  11    Authorization - Number of Visits  30    PT Start Time  7253    PT Stop Time  1529    PT Time Calculation (min)  44 min    Activity Tolerance  Patient tolerated treatment well;No increased pain    Behavior During Therapy  WFL for tasks assessed/performed       Past Medical History:  Diagnosis Date  . Achilles tendonitis   . Arthritis    lower back  . Diabetes ([place])   . H/O hiatal hernia   . Hypertension   . Lower back pain   . Sprain and strain of calcaneofibular ligament     Past Surgical History:  Procedure Laterality Date  . COLONOSCOPY N/A 07/14/2013   Performed by Garlan Fair, MD at [place]    There were no vitals filed for this visit.  Subjective Assessment - 02/25/17 1450    Subjective  Pt reports that he thinks the patch helped following his last session, noting increase in his range without the "catch". He did not have time to complete his exercises much yesterday and noticed increased stiffness this morning. No pain currently however.     Pertinent History  pronounced "Ah-Gee"    Limitations  Lifting    How long can you sit comfortably?  unlimited    How long can you stand comfortably?  unlimited    How long can you walk comfortably?  unlimited    Patient Stated Goals  improve shoulder ROM and strength     Currently in Pain?  No/denies    Pain Onset  More than a month ago         [facility] PT Assessment - 02/25/17 0001      AROM   Left Shoulder  External Rotation  50 Degrees                  [facility] Adult PT Treatment/Exercise - 02/25/17 0001      Shoulder Exercises: Supine   External Rotation  15 reps;Strengthening    Flexion  Both;15 reps;Strengthening    Shoulder Flexion Weight (lbs)  4#    Flexion Limitations  using a cane      Shoulder Exercises: Seated   Other Seated Exercises  blue physioball walk from Lt shoulder flexion to extension x10 reps       Shoulder Exercises: Standing   Other Standing Exercises  Closed chain plank on counter with side/side, ant/post rocking x20 reps; shoulder depression hold side/side x20 reps       Shoulder Exercises: Pulleys   Other Pulley Exercises  Lt shoulder IR with 10 sec hold, x75min       Shoulder Exercises: ROM/Strengthening   UBE (Upper Arm Bike)  L2, 2' forward/2' backward    Other ROM/Strengthening Exercises  closed chain serratus punches x15 reps with elbows extended      Iontophoresis   Location  Lt anterior shoulder     Dose  1 ml    Time  6 hour patch       Manual Therapy   Joint Mobilization  MWM into Lt shoulder ER, 90 deg abduction 3x10 reps              PT Education - 02/25/17 1536    Education provided  Yes    Education Details  wear time of ionto patch    Person(s) Educated  Patient    Methods  Explanation    Comprehension  Verbalized understanding       PT Short Term Goals - 02/12/17 1532      PT SHORT TERM GOAL #2   Title  Pt will demo improve shoulder abduction PROM to atleast 110 deg, which will assist with his ability to complete shoulder strengthening therex.     Time  2    Period  Weeks    Status  Achieved        PT Long Term Goals - 02/25/17 1545      PT LONG TERM GOAL #1   Title  Pt will demo consistency and independence with his advanced HEP to prepare him for discharge home.     Time  4    Period  Weeks    Status  On-going      PT LONG TERM GOAL #2   Title  Pt will demo improved Lt shoulder AROM to atleast 140 deg  flexion and 120 deg abduction to allow him to complete daily tasks such as combing and washing his hair.    Baseline  flexion achieved, abduction achieved in scapular plane only     Time  4    Period  Weeks    Status  Achieved      PT LONG TERM GOAL #3   Title  Pt will demo improved Lt shoulder strength to 5/5 MMT, which will assist with lifting small objects and placing them above shoulder height.     Time  4    Period  Weeks    Status  On-going      PT LONG TERM GOAL #4   Title  Pt will report atleast 50% improvement in his LUE function, which will allow him to complete daily activites without assistance.     Time  4    Period  Weeks    Status  On-going            Plan - 02/25/17 1537    Clinical Impression Statement  Pt continues to complete his HEP at home regularly, reporting no pain upon arrival today. Therapist completed manual techniques, noting improvement in shoulder external rotation at 90 deg of abduction up to 60 deg without pain compared to 50 deg prior. Pt also reported improvement in his ability to complete shoulder flexion strengthening without pain. Ended with application of iontophoresis patch. Will continue with current POC.     Rehab Potential  Good    Clinical Impairments Affecting Rehab Potential  Patient had a left shoulder manipulation    PT Frequency  2x / week    PT Duration  4 weeks    PT Treatment/Interventions  Cryotherapy;Electrical Stimulation;Moist Heat;Therapeutic exercise;Therapeutic activities;Neuromuscular re-education;Patient/family education;Manual techniques;Dry needling;Passive range of motion;Taping;Iontophoresis 4mg /ml Dexamethasone    PT Next Visit Plan   f/u on ionto patch, closed chain exercises; PNF pattern; joint mobilization to left shoulder with ROM; exercises that work on strength and  ROM; reach into cabinets    PT Home Exercise Plan  progress as  needed    Consulted and Agree with Plan of Care  Patient       Patient will  benefit from skilled therapeutic intervention in order to improve the following deficits and impairments:  Decreased activity tolerance, Decreased range of motion, Decreased strength, Hypomobility, Impaired UE functional use, Impaired flexibility, Pain  Visit Diagnosis: Stiffness of left shoulder, not elsewhere classified  Acute pain of left shoulder  Muscle weakness (generalized)     Problem List Patient Active Problem List   Diagnosis Date Noted  . Elevated PSA 02/17/2016  . Dyslipidemia 02/17/2016  . Diabetes mellitus type 2, uncontrolled ([place]) 02/15/2016  . Well adult exam 02/15/2016  . History of colonic polyps 02/15/2016  . RBBB 02/15/2016  . Essential hypertension 02/09/2015  . Right leg DVT ([place]) 10/30/2014  . Gout 10/30/2014  . Stye external 10/30/2014  . Leg pain, right 10/29/2014  . ACHILLES TENDINITIS 01/12/2008  . SPRAIN AND STRAIN OF CALCANEOFIBULAR 01/12/2008    3:49 PM,02/25/17 Elly Modena PT, DPT [place] [facility] [address], [place], [place], [address] Phone: [phone]   Fax:  [phone]  Name: [name] MRN: [id number] Date of Birth: 10-Jun-1960

## 2017-03-04 ENCOUNTER — Ambulatory Visit: Payer: PRIVATE HEALTH INSURANCE | Admitting: Physical Therapy

## 2017-03-04 DIAGNOSIS — M25512 Pain in left shoulder: Secondary | ICD-10-CM

## 2017-03-04 DIAGNOSIS — M25612 Stiffness of left shoulder, not elsewhere classified: Secondary | ICD-10-CM | POA: Diagnosis not present

## 2017-03-04 DIAGNOSIS — M6281 Muscle weakness (generalized): Secondary | ICD-10-CM

## 2017-03-04 NOTE — Therapy (Signed)
[facility] [address] [place], [place], [address] Phone: [phone]   Fax:  [phone]  Physical Therapy Treatment  Patient Details  Name: [name] MRN: [id number] Date of Birth: 04-Jun-1960 Referring Provider: Dr. Marchia Bond   Encounter Date: 03/04/2017  PT End of Session - 03/04/17 1446    Visit Number  12    Date for PT Re-Evaluation  03/25/17    Authorization Type  Medcost/Tricare    Authorization Time Period  01/28/17 to 02/25/17    Authorization - Visit Number  12    Authorization - Number of Visits  30    PT Start Time  1093    PT Stop Time  2355    PT Time Calculation (min)  43 min    Activity Tolerance  Patient tolerated treatment well;No increased pain    Behavior During Therapy  WFL for tasks assessed/performed       Past Medical History:  Diagnosis Date  . Achilles tendonitis   . Arthritis    lower back  . Diabetes ([place])   . H/O hiatal hernia   . Hypertension   . Lower back pain   . Sprain and strain of calcaneofibular ligament     Past Surgical History:  Procedure Laterality Date  . COLONOSCOPY N/A 07/14/2013   Procedure: COLONOSCOPY;  Surgeon: Garlan Fair, MD;  Location: [facility];  Service: Endoscopy;  Laterality: N/A;    There were no vitals filed for this visit.  Subjective Assessment - 03/04/17 1404    Subjective  Pt states he didn't notice a difference with the patch the second time.  States feeling about the same as last time.    Patient is accompained by:  Family member    Pertinent History  pronounced "Ah-Gee"    Limitations  Lifting    Patient Stated Goals  improve shoulder ROM and strength     Currently in Pain?  Yes    Pain Score  1     Pain Location  Shoulder    Pain Orientation  Left    Pain Descriptors / Indicators  Aching    Pain Type  Acute pain    Pain Onset  More than a month ago    Pain Frequency  Intermittent                       [facility] Adult PT Treatment/Exercise - 03/04/17 0001      Shoulder Exercises: Supine   Horizontal ABduction  Strengthening;Both;20 reps;Theraband    Theraband Level (Shoulder Horizontal ABduction)  Level 2 (Red)    External Rotation  Strengthening;20 reps;Theraband    Theraband Level (Shoulder External Rotation)  Level 2 (Red)    Flexion  Both;Strengthening;20 reps    Shoulder Flexion Weight (lbs)  4#    Flexion Limitations  using a cane    Other Supine Exercises  serratus punch - 4 lb -       Shoulder Exercises: Standing   Flexion  AAROM;Left;10 reps 10 sec hold rolling ball up the wall    Other Standing Exercises  Closed chain on red ball wall push up, mini planks      Shoulder Exercises: Pulleys   Other Pulley Exercises  Lt shoulder IR with 10 sec hold, x68min       Shoulder Exercises: ROM/Strengthening   UBE (Upper Arm Bike)  L2, 2' forward/2' backward      Iontophoresis   Type of  Iontophoresis  Dexamethasone    Location  Lt anterior shoulder     Dose  1 ml Lot #[id number]    Time  6 hour patch       Manual Therapy   Joint Mobilization  AP mobs grade II-III    Soft tissue mobilization  left pec and anterior deltoid               PT Short Term Goals - 02/12/17 1532      PT SHORT TERM GOAL #2   Title  Pt will demo improve shoulder abduction PROM to atleast 110 deg, which will assist with his ability to complete shoulder strengthening therex.     Time  2    Period  Weeks    Status  Achieved        PT Long Term Goals - 02/25/17 1545      PT LONG TERM GOAL #1   Title  Pt will demo consistency and independence with his advanced HEP to prepare him for discharge home.     Time  4    Period  Weeks    Status  On-going      PT LONG TERM GOAL #2   Title  Pt will demo improved Lt shoulder AROM to atleast 140 deg flexion and 120 deg abduction to allow him to complete daily tasks such as combing and washing his hair.    Baseline  flexion  achieved, abduction achieved in scapular plane only     Time  4    Period  Weeks    Status  Achieved      PT LONG TERM GOAL #3   Title  Pt will demo improved Lt shoulder strength to 5/5 MMT, which will assist with lifting small objects and placing them above shoulder height.     Time  4    Period  Weeks    Status  On-going      PT LONG TERM GOAL #4   Title  Pt will report atleast 50% improvement in his LUE function, which will allow him to complete daily activites without assistance.     Time  4    Period  Weeks    Status  On-going            Plan - 03/04/17 1447    Clinical Impression Statement  Pt tolerated exercises well and was monitored for pain.  Reports no changes since previous visit.  He needs cues to stabilize scapula during exercises.  Pt continues to have some pain at end range of motion.  Pt will benefit from skilled PT for managment of symptoms and strength to return to functional activiites.    Clinical Impairments Affecting Rehab Potential  Patient had a left shoulder manipulation    PT Treatment/Interventions  Cryotherapy;Electrical Stimulation;Moist Heat;Therapeutic exercise;Therapeutic activities;Neuromuscular re-education;Patient/family education;Manual techniques;Dry needling;Passive range of motion;Taping;Iontophoresis 4mg /ml Dexamethasone    PT Next Visit Plan  ionto #4, [place] if needed (pt interested in trying), closed chain exercises; PNF pattern; joint mobilization to left shoulder with ROM; exercises that work on strength and  ROM; reach into cabinets    Consulted and Agree with Plan of Care  Patient       Patient will benefit from skilled therapeutic intervention in order to improve the following deficits and impairments:  Decreased activity tolerance, Decreased range of motion, Decreased strength, Hypomobility, Impaired UE functional use, Impaired flexibility, Pain  Visit Diagnosis: Stiffness of left shoulder, not elsewhere classified  Acute pain  of  left shoulder  Muscle weakness (generalized)     Problem List Patient Active Problem List   Diagnosis Date Noted  . Elevated PSA 02/17/2016  . Dyslipidemia 02/17/2016  . Diabetes mellitus type 2, uncontrolled ([place]) 02/15/2016  . Well adult exam 02/15/2016  . History of colonic polyps 02/15/2016  . RBBB 02/15/2016  . Essential hypertension 02/09/2015  . Right leg DVT ([place]) 10/30/2014  . Gout 10/30/2014  . Stye external 10/30/2014  . Leg pain, right 10/29/2014  . ACHILLES TENDINITIS 01/12/2008  . SPRAIN AND STRAIN OF CALCANEOFIBULAR 01/12/2008    Zannie Cove, PT 03/04/2017, 2:50 PM   [facility] [address] [place], [place], [address] Phone: [phone]   Fax:  [phone]  Name: [name] MRN: [id number] Date of Birth: 02/28/1961

## 2017-03-07 ENCOUNTER — Ambulatory Visit: Payer: PRIVATE HEALTH INSURANCE | Admitting: Physical Therapy

## 2017-03-07 DIAGNOSIS — M25612 Stiffness of left shoulder, not elsewhere classified: Secondary | ICD-10-CM

## 2017-03-07 DIAGNOSIS — M25512 Pain in left shoulder: Secondary | ICD-10-CM

## 2017-03-07 DIAGNOSIS — M6281 Muscle weakness (generalized): Secondary | ICD-10-CM

## 2017-03-07 NOTE — Patient Instructions (Signed)
    Use yellow band, x15-20 reps pain free.       INTERNAL ROTATION STRETCH - PULLEY IR  Using door pulleys and facing away from the door, slowly pull down with your unaffected arm so that your affected arm raises up behind your back until a stretch is felt.  Your affected arm should be relaxed. The unaffected arm does the work.         Elevated Pushup  Begin in a pushup position with your hands elevated on a table or chair as pictured. The hands should be under the shoulders, knees off the the floor and up on the toes. Next, push up while keeping the back straight and your core tight.  15-20 reps on countertop or against wall if pain free   [facility] [address], [place], [place] [address] Phone # [phone] Fax [phone]

## 2017-03-07 NOTE — Therapy (Signed)
[facility] [address] [place], [place], [address] Phone: [phone]   Fax:  [phone]  Physical Therapy Treatment  Patient Details  Name: [name] MRN: [id number] Date of Birth: 1961-01-05 Referring Provider: Dr. Marchia Bond   Encounter Date: 03/07/2017  PT End of Session - 03/07/17 1445    Visit Number  13    Date for PT Re-Evaluation  03/25/17    Authorization Type  Medcost/Tricare    Authorization Time Period  01/28/17 to 02/25/17    Authorization - Visit Number  13    Authorization - Number of Visits  30    PT Start Time  0867    PT Stop Time  6195    PT Time Calculation (min)  43 min    Activity Tolerance  Patient tolerated treatment well;No increased pain    Behavior During Therapy  WFL for tasks assessed/performed       Past Medical History:  Diagnosis Date  . Achilles tendonitis   . Arthritis    lower back  . Diabetes ([place])   . H/O hiatal hernia   . Hypertension   . Lower back pain   . Sprain and strain of calcaneofibular ligament     Past Surgical History:  Procedure Laterality Date  . COLONOSCOPY N/A 07/14/2013   Procedure: COLONOSCOPY;  Surgeon: Garlan Fair, MD;  [facility]: [facility];  Service: Endoscopy;  Laterality: N/A;    There were no vitals filed for this visit.  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 His shoulder was a little sore afterwards. He continues to have the pain with shoulder abduction at mid-range.     Patient is accompained by:  Family member    Pertinent History  pronounced "Ah-Gee"    Limitations  Lifting    Patient Stated Goals  improve shoulder ROM and strength     Currently in Pain?  Yes    Pain Score  1  Lt anterior shoulder     Pain Onset  More than a month ago                       [facility] Adult PT Treatment/Exercise - 03/07/17 0001      Shoulder Exercises: Supine   Flexion  Both;Strengthening;20 reps    Shoulder Flexion Weight (lbs)  5#    Flexion Limitations  using a cane     Other Supine Exercises  D2 flexion with yellow TB 2x15 reps, LLE only       Shoulder Exercises: Seated   Flexion  Both;AAROM;10 reps;Limitations    Flexion Limitations  x2 sets, 5#      Shoulder Exercises: Standing   Other Standing Exercises  Shoulder adduction with therapist assistance to decrease pain, x10 reps, yellow TB       Shoulder Exercises: ROM/Strengthening   UBE (Upper Arm Bike)  L2, 2' forward/2' backward      Iontophoresis   Location  Lt anterior shoulder     Dose  1 ml Lot # [id number]    Time  6 hour patch       Manual Therapy   Joint Mobilization  MWM into Lt shoulder ER at 90 deg abduction 3x10 reps     Soft tissue mobilization  Lt teres region/infraspinatus  PT Education - 03/07/17 1444    Education provided  Yes    Education Details  updates to [employer]) Educated  Patient    Methods  Explanation;Verbal cues;Handout    Comprehension  Verbalized understanding;Returned demonstration       PT Short Term Goals - 02/12/17 1532      PT SHORT TERM GOAL #2   Title  Pt will demo improve shoulder abduction PROM to atleast 110 deg, which will assist with his ability to complete shoulder strengthening therex.     Time  2    Period  Weeks    Status  Achieved        PT Long Term Goals - 02/25/17 1545      PT LONG TERM GOAL #1   Title  Pt will demo consistency and independence with his advanced HEP to prepare him for discharge home.     Time  4    Period  Weeks    Status  On-going      PT LONG TERM GOAL #2   Title  Pt will demo improved Lt shoulder AROM to atleast 140 deg flexion and 120 deg abduction to allow him to complete daily tasks such as combing and washing his hair.    Baseline  flexion achieved,  abduction achieved in scapular plane only     Time  4    Period  Weeks    Status  Achieved      PT LONG TERM GOAL #3   Title  Pt will demo improved Lt shoulder strength to 5/5 MMT, which will assist with lifting small objects and placing them above shoulder height.     Time  4    Period  Weeks    Status  On-going      PT LONG TERM GOAL #4   Title  Pt will report atleast 50% improvement in his LUE function, which will allow him to complete daily activites without assistance.     Time  4    Period  Weeks    Status  On-going            Plan - 03/07/17 1446    Clinical Impression Statement  Pt arrived today with continued complaints of Lt anterior shoulder pain. Focused on therex and manual techniques to address ROM into shoulder ER as well as general shoulder elevation, noting improvements in his ability to complete shoulder PNF patterns without pain. Therapist made some adjustments to pt's HEP to improve adherence and he verbalized understanding of these updates. Will continue with current POC to improve end ranges of shoulder motion as well as posterior shoulder strength and scapular control.     Clinical Impairments Affecting Rehab Potential  Patient had a left shoulder manipulation    PT Treatment/Interventions  Cryotherapy;Electrical Stimulation;Moist Heat;Therapeutic exercise;Therapeutic activities;Neuromuscular re-education;Patient/family education;Manual techniques;Dry needling;Passive range of motion;Taping;Iontophoresis 4mg /ml Dexamethasone    PT Next Visit Plan  follow up on ionto and consider [place] if pt still interested, closed chain exercises; PNF pattern; joint mobilization to left shoulder with ROM; exercises that work on strength and  ROM; reach into cabinets    Consulted and Agree with Plan of Care  Patient       Patient will benefit from skilled therapeutic intervention in order to improve the following deficits and impairments:  Decreased activity tolerance, Decreased  range of motion, Decreased strength, Hypomobility, Impaired UE functional use, Impaired flexibility, Pain  Visit Diagnosis: Stiffness of left shoulder,  not elsewhere classified  Acute pain of left shoulder  Muscle weakness (generalized)     Problem List Patient Active Problem List   Diagnosis Date Noted  . Elevated PSA 02/17/2016  . Dyslipidemia 02/17/2016  . Diabetes mellitus type 2, uncontrolled ([place]) 02/15/2016  . Well adult exam 02/15/2016  . History of colonic polyps 02/15/2016  . RBBB 02/15/2016  . Essential hypertension 02/09/2015  . Right leg DVT ([place]) 10/30/2014  . Gout 10/30/2014  . Stye external 10/30/2014  . Leg pain, right 10/29/2014  . ACHILLES TENDINITIS 01/12/2008  . SPRAIN AND STRAIN OF CALCANEOFIBULAR 01/12/2008   3:10 PM,03/07/17 Elly Modena PT, DPT [facility] [address] [place], [place], [address] Phone: [phone]   Fax:  [phone]  Name: [name] MRN: [id number] Date of Birth: 1960/12/15

## 2017-03-11 ENCOUNTER — Ambulatory Visit: Payer: PRIVATE HEALTH INSURANCE | Attending: Orthopedic Surgery | Admitting: Physical Therapy

## 2017-03-11 ENCOUNTER — Encounter: Payer: Self-pay | Admitting: Physical Therapy

## 2017-03-11 DIAGNOSIS — M6281 Muscle weakness (generalized): Secondary | ICD-10-CM | POA: Insufficient documentation

## 2017-03-11 DIAGNOSIS — M25512 Pain in left shoulder: Secondary | ICD-10-CM | POA: Diagnosis present

## 2017-03-11 DIAGNOSIS — M25612 Stiffness of left shoulder, not elsewhere classified: Secondary | ICD-10-CM | POA: Diagnosis not present

## 2017-03-11 NOTE — Therapy (Addendum)
[facility] [address], [place], [place], [address] Phone: [phone]   Fax:  [phone]  Physical Therapy Treatment  Patient Details  Name: [name] MRN: [id number] Date of Birth: 1961/01/13 Referring Provider: Dr. Marchia Bond   Encounter Date: 03/11/2017  PT End of Session - 03/11/17 1401    Visit Number  14    Date for PT Re-Evaluation  03/25/17    Authorization Type  Medcost/Tricare    Authorization Time Period  01/28/17 to 02/25/17    Authorization - Visit Number  14    Authorization - Number of Visits  30    PT Start Time  1401    PT Stop Time  5726    PT Time Calculation (min)  46 min    Activity Tolerance  Patient tolerated treatment well;No increased pain    Behavior During Therapy  WFL for tasks assessed/performed       Past Medical History:  Diagnosis Date  . Achilles tendonitis   . Arthritis    lower back  . Diabetes ([place])   . H/O hiatal hernia   . Hypertension   . Lower back pain   . Sprain and strain of calcaneofibular ligament     Past Surgical History:  Procedure Laterality Date  . COLONOSCOPY N/A 07/14/2013   Procedure: COLONOSCOPY;  Surgeon: Garlan Fair, MD;  [facility]: [facility];  Service: Endoscopy;  Laterality: N/A;    There were no vitals filed for this visit.  Subjective Assessment - 03/11/17 1410    Subjective  I have no pain at rest. Behind the back is the worst    Patient Stated Goals  improve shoulder ROM and strength     Currently in Pain?  No/denies                      [facility] Adult PT Treatment/Exercise - 03/11/17 0001      Shoulder Exercises: Supine   Flexion  Both;Strengthening;20 reps    Shoulder Flexion Weight (lbs)  5#    Flexion Limitations  using a cane     Other Supine Exercises  D2 flexion with yellow TB 2x15 reps, LUE only; Horizontal abduction 2x 15      Shoulder Exercises: Standing   Flexion  Strengthening;Left;20  reps;Theraband yellow band    Other Standing Exercises  shoulder stabilization with red ball against wall A-Z      Shoulder Exercises: ROM/Strengthening   UBE (Upper Arm Bike)  L2, 2' forward/2' backward      Ultrasound   Ultrasound Location  anterior humeral head    Ultrasound Parameters  1.2 intensity, 20% duty, 1MHz    Ultrasound Goals  Pain;Other (Comment) inflammation      Iontophoresis   Type of Iontophoresis  --    Location  --    Dose  --    Time  --      Manual Therapy   Joint Mobilization  MWM into Lt shoulder ER at 90 deg abduction 3x10 reps     Soft tissue mobilization  anterior deltoid               PT Short Term Goals - 02/12/17 1532      PT SHORT TERM GOAL #2   Title  Pt will demo improve shoulder abduction PROM to atleast 110 deg, which will assist with his ability to complete shoulder strengthening therex.     Time  2  Period  Weeks    Status  Achieved        PT Long Term Goals - 03/11/17 1418      PT LONG TERM GOAL #1   Title  Pt will demo consistency and independence with his advanced HEP to prepare him for discharge home.     Time  4    Period  Weeks    Status  On-going      PT LONG TERM GOAL #2   Title  Pt will demo improved Lt shoulder AROM to atleast 140 deg flexion and 120 deg abduction to allow him to complete daily tasks such as combing and washing his hair.    Baseline  flexion achieved, abduction achieved in scapular plane only     Time  4    Period  Weeks    Status  Achieved      PT LONG TERM GOAL #3   Title  Pt will demo improved Lt shoulder strength to 5/5 MMT, which will assist with lifting small objects and placing them above shoulder height.     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      Baseline  more than 50%    Time  4    Period  Weeks    Status  Achieved            Plan - 03/11/17 1422    Clinical Impression Statement  Pt wanted to attempt [place] and will follow up at next visit on results.  Pt met long term goal for reduced pain and is more than 50% improved with his daily activities. Pt  will benefit from skilled PT to progress strength andROM to return to maximum function.  Possible discharge next session    Clinical Impairments Affecting Rehab Potential  Patient had a left shoulder manipulation    PT Treatment/Interventions  Cryotherapy;Electrical Stimulation;Moist Heat;Therapeutic exercise;Therapeutic activities;Neuromuscular re-education;Patient/family education;Manual techniques;Dry needling;Passive range of motion;Taping;Iontophoresis '4mg'$ /ml Dexamethasone    PT Next Visit Plan  discharge possible, check progress, follow up on ionto and [place] if pt still interested, closed chain exercises; PNF pattern; joint mobilization to left shoulder with ROM; exercises that work on strength and  ROM; reach into cabinets    PT Home Exercise Plan  progress as needed    Consulted and Agree with Plan of Care  Patient       Patient will benefit from skilled therapeutic intervention in order to improve the following deficits and impairments:  Decreased activity tolerance, Decreased range of motion, Decreased strength, Hypomobility, Impaired UE functional use, Impaired flexibility, Pain  Visit Diagnosis: Stiffness of left shoulder, not elsewhere classified  Acute pain of left shoulder  Muscle weakness (generalized)     Problem List Patient Active Problem List   Diagnosis Date Noted  . Elevated PSA 02/17/2016  . Dyslipidemia 02/17/2016  . Diabetes mellitus type 2, uncontrolled ([place]) 02/15/2016  . Well adult exam 02/15/2016  . History of colonic polyps 02/15/2016  . RBBB 02/15/2016  . Essential hypertension 02/09/2015  . Right leg DVT ([place]) 10/30/2014  . Gout  10/30/2014  . Stye external 10/30/2014  . Leg pain, right 10/29/2014  .  ACHILLES TENDINITIS 01/12/2008  . SPRAIN AND STRAIN OF CALCANEOFIBULAR 01/12/2008    Zannie Cove, PT 03/11/2017, 3:33 PM  [facility] [address], [place], [place], [address] Phone: [phone]   Fax:  [phone]  Name: [name] MRN: [id number] Date of Birth: 1961/01/08  PHYSICAL THERAPY DISCHARGE SUMMARY  Visits from Start of Care: 14  Current functional level related to goals / functional outcomes: See Jethro Poling   Remaining deficits: See above   Education / Equipment: HEP Plan: Patient agrees to discharge.  Patient goals were partially met. Patient is being discharged due to not returning since the last visit.  ?????    [employer], PT 04/29/17 10:45 AM

## 2017-03-14 ENCOUNTER — Encounter: Payer: PRIVATE HEALTH INSURANCE | Admitting: Physical Therapy

## 2017-03-20 ENCOUNTER — Encounter: Payer: Self-pay | Admitting: Internal Medicine

## 2017-03-20 ENCOUNTER — Ambulatory Visit (INDEPENDENT_AMBULATORY_CARE_PROVIDER_SITE_OTHER): Payer: PRIVATE HEALTH INSURANCE | Admitting: Internal Medicine

## 2017-03-20 DIAGNOSIS — J209 Acute bronchitis, unspecified: Secondary | ICD-10-CM | POA: Insufficient documentation

## 2017-03-20 DIAGNOSIS — I10 Essential (primary) hypertension: Secondary | ICD-10-CM

## 2017-03-20 DIAGNOSIS — E1165 Type 2 diabetes mellitus with hyperglycemia: Secondary | ICD-10-CM

## 2017-03-20 MED ORDER — PROMETHAZINE-CODEINE 6.25-10 MG/5ML PO SYRP: 5.0000 mL | ORAL_SOLUTION | ORAL | 1 refills | Status: DC | PRN

## 2017-03-20 MED ORDER — CEFDINIR 300 MG PO CAPS: 300.0000 mg | ORAL_CAPSULE | Freq: Two times a day (BID) | ORAL | 0 refills | Status: DC

## 2017-03-20 NOTE — Patient Instructions (Signed)
You can use over-the-counter  "cold" medicines  such as  "Afrin" nasal spray for nasal congestion as directed. Use " Delsym" or" Robitussin" cough syrup varietis for cough.  You can use plain "Tylenol" or "Advil" for fever, chills and achyness. Use [place] or Ricola cough drops.     Please, make an appointment if you are not better or if you're worse.  

## 2017-03-20 NOTE — Assessment & Plan Note (Addendum)
[place] Prom-cod syr CXR if not better

## 2017-03-20 NOTE — Assessment & Plan Note (Addendum)
[employer]

## 2017-03-20 NOTE — Progress Notes (Signed)
Subjective:  Patient ID: [name], male    DOB: September 14, 1960  Age: 56 y.o. MRN: [id number]  CC: No chief complaint on file.   HPI [name] presents for DM, HTN, obesity C/o cough x 4 d - severe and productive [name] is moving to [name]   Outpatient Medications Prior to Visit  Medication Sig Dispense Refill  . allopurinol (ZYLOPRIM) 100 MG tablet Take 1 tablet (100 mg total) by mouth 2 (two) times daily. 180 tablet 3  . aspirin EC 81 MG tablet Take 81 mg by mouth daily.    . Dapagliflozin-Metformin HCl ER (XIGDUO XR) 08-998 MG TB24 Take 1 tablet by mouth 2 (two) times daily. 180 tablet 3  . fosinopril (MONOPRIL) 10 MG tablet Take 1 tablet (10 mg total) by mouth daily. 90 tablet 3  . glucose blood test strip Use as instructed. DX E11.65 100 each 12  . Lancets (ACCU-CHEK MULTICLIX) lancets Use as instructed. DX: E11.65 102 each 11  . metoprolol succinate (TOPROL-XL) 25 MG 24 hr tablet Take 1 tablet (25 mg total) by mouth daily. 90 tablet 3  . Omega-3 Fatty Acids (FISH OIL CONCENTRATE PO) Take 2 capsules by mouth daily.     Marland Kitchen atorvastatin (LIPITOR) 40 MG tablet Take 1 tablet (40 mg total) by mouth daily. 90 tablet 3   No facility-administered medications prior to visit.     ROS Review of Systems  Constitutional: Positive for fatigue. Negative for appetite change and unexpected weight change.  HENT: Negative for congestion, nosebleeds, sneezing, sore throat and trouble swallowing.   Eyes: Negative for itching and visual disturbance.  Respiratory: Positive for cough.   Cardiovascular: Negative for chest pain, palpitations and leg swelling.  Gastrointestinal: Negative for abdominal distention, blood in stool, diarrhea and nausea.  Genitourinary: Negative for frequency and hematuria.  Musculoskeletal: Positive for arthralgias. Negative for back pain, gait problem, joint swelling and neck pain.  Skin: Negative for rash.  Neurological: Negative for dizziness, tremors, speech  difficulty and weakness.  Psychiatric/Behavioral: Negative for agitation, dysphoric mood and sleep disturbance. The patient is not nervous/anxious.     Objective:  BP 118/82 (BP Location: Right Arm, Patient Position: Sitting, Cuff Size: Large)   Pulse 79   Temp 97.9 F (36.6 C) (Oral)   Ht 6' 1.5" (1.867 m)   Wt 277 lb (125.6 kg)   SpO2 99%   BMI 36.05 kg/m   BP Readings from Last 3 Encounters:  03/20/17 118/82  12/19/16 122/78  08/08/16 126/84    Wt Readings from Last 3 Encounters:  03/20/17 277 lb (125.6 kg)  12/19/16 288 lb (130.6 kg)  08/08/16 299 lb 0.6 oz (135.6 kg)    Physical Exam  Constitutional: He is oriented to person, place, and time. He appears well-developed. No distress.  NAD  HENT:  Mouth/Throat: Oropharynx is clear and moist.  Eyes: Conjunctivae are normal. Pupils are equal, round, and reactive to light.  Neck: Normal range of motion. No JVD present. No thyromegaly present.  Cardiovascular: Normal rate, regular rhythm, normal heart sounds and intact distal pulses. Exam reveals no gallop and no friction rub.  No murmur heard. Pulmonary/Chest: Effort normal. No respiratory distress. He has no wheezes. He has rales. He exhibits no tenderness.  Abdominal: Soft. Bowel sounds are normal. He exhibits no distension and no mass. There is no tenderness. There is no rebound and no guarding.  Musculoskeletal: Normal range of motion. He exhibits no edema or tenderness.  Lymphadenopathy:    He  has no cervical adenopathy.  Neurological: He is alert and oriented to person, place, and time. He has normal reflexes. No cranial nerve deficit. He exhibits normal muscle tone. He displays a negative Romberg sign. Coordination and gait normal.  Skin: Skin is warm and dry. No rash noted.  Psychiatric: He has a normal mood and affect. His behavior is normal. Judgment and thought content normal.    Lab Results  Component Value Date   WBC 6.3 02/16/2016   HGB 16.6 02/16/2016    HCT 48.4 02/16/2016   PLT 246.0 02/16/2016   GLUCOSE 124 (H) 12/26/2016   CHOL 197 08/08/2016   TRIG 229.0 (H) 08/08/2016   HDL 61.00 08/08/2016   LDLDIRECT 96.0 08/08/2016   ALT 25 12/26/2016   AST 15 12/26/2016   NA 137 12/26/2016   K 4.3 12/26/2016   CL 103 12/26/2016   CREATININE 0.74 12/26/2016   BUN 27 (H) 12/26/2016   CO2 26 12/26/2016   TSH 1.46 02/16/2016   PSA 2.76 12/26/2016   INR 1.1 (H) 10/29/2014   HGBA1C 6.2 12/26/2016    Mr Shoulder Left Wo Contrast  Result Date: 10/29/2016 CLINICAL DATA:  Left shoulder pain with limited range of motion for 7 months. Progressively worsening. EXAM: MRI OF THE LEFT SHOULDER WITHOUT CONTRAST TECHNIQUE: Multiplanar, multisequence MR imaging of the shoulder was performed. No intravenous contrast was administered. COMPARISON:  None. FINDINGS: Rotator cuff: Moderate tendinosis of the supraspinatus tendon with a small partial-thickness articular surface tear of the anterior fibers. Mild tendinosis of the infraspinatus tendon. Teres minor tendon is intact. Subscapularis tendon is intact. Muscles: No atrophy or fatty replacement of nor abnormal signal within, the muscles of the rotator cuff. Biceps long head: Mild tendinosis of the intraarticular portion of the long head of the biceps tendon. Acromioclavicular Joint: Moderate arthropathy of the acromioclavicular joint. Type II acromion. Trace subacromial/subdeltoid bursal fluid. Glenohumeral Joint: No joint effusion. No chondral defect. Labrum: Grossly intact, but evaluation is limited by lack of intraarticular fluid. Bones:  No marrow abnormality, fracture or dislocation. Other: None. IMPRESSION: 1. Moderate tendinosis of the supraspinatus tendon with a small partial-thickness articular surface tear of the anterior fibers. 2. Mild tendinosis of the infraspinatus tendon. 3. Mild tendinosis of the intraarticular portion of the long head of the biceps tendon. Electronically Signed   By: Kathreen Devoid   On:  10/29/2016 09:30    Assessment & Plan:   There are no diagnoses linked to this encounter. I am having [name] maintain his Omega-3 Fatty Acids (FISH OIL CONCENTRATE PO), aspirin EC, glucose blood, accu-chek multiclix, metoprolol succinate, fosinopril, Dapagliflozin-Metformin HCl ER, atorvastatin, and allopurinol.  No orders of the defined types were placed in this encounter.    Follow-up: No Follow-up on file.  Walker Kehr, MD

## 2017-03-20 NOTE — Assessment & Plan Note (Signed)
Toprol, Monopril

## 2017-03-22 ENCOUNTER — Encounter: Payer: Self-pay | Admitting: Cardiology

## 2017-03-24 NOTE — Progress Notes (Signed)
Cardiology Office Note   Date:  03/28/2017   ID:  [name], DOB [birth year], MRN [id number]  PCP:  Cassandria Anger, MD  Cardiologist:   Minus Breeding, MD  Referring:  Cassandria Anger, MD  Chief Complaint  Patient presents with  . Coronary Calcium      History of Present Illness: [name] is a 56 y.o. male who presents for follow up of elevated coronary calcium.  He had a follow up negative POET (Plain Old Exercise Treadmill).  Since then he has done exceptionally well.  He is lost almost 30 pounds.  I note that his hemoglobin A1c is down LDL.  He is walking more.  He is eating better. The patient denies any new symptoms such as chest discomfort, neck or arm discomfort. There has been no new shortness of breath, PND or orthopnea. There have been no reported palpitations, presyncope or syncope.   Past Medical History:  Diagnosis Date  . Achilles tendonitis   . Arthritis    lower back  . Diabetes ([place])   . H/O hiatal hernia   . Hypertension   . Lower back pain   . Sprain and strain of calcaneofibular ligament     Past Surgical History:  Procedure Laterality Date  . COLONOSCOPY N/A 07/14/2013   Procedure: COLONOSCOPY;  Surgeon: Garlan Fair, MD;  Location: [facility];  Service: Endoscopy;  Laterality: N/A;     Current Outpatient Medications  Medication Sig Dispense Refill  . allopurinol (ZYLOPRIM) 100 MG tablet Take 1 tablet (100 mg total) by mouth 2 (two) times daily. 180 tablet 3  . aspirin EC 81 MG tablet Take 81 mg by mouth daily.    Marland Kitchen atorvastatin (LIPITOR) 40 MG tablet Take 1 tablet (40 mg total) by mouth daily. 90 tablet 3  . cefdinir (OMNICEF) 300 MG capsule Take 1 capsule (300 mg total) by mouth 2 (two) times daily. 20 capsule 0  . Dapagliflozin-Metformin HCl ER (XIGDUO XR) 08-998 MG TB24 Take 1 tablet by mouth 2 (two) times daily. 180 tablet 3  . fosinopril (MONOPRIL) 10 MG tablet Take 1 tablet (10 mg total) by mouth daily. 90 tablet 3    . glucose blood test strip Use as instructed. DX E11.65 100 each 12  . Lancets (ACCU-CHEK MULTICLIX) lancets Use as instructed. DX: E11.65 102 each 11  . metoprolol succinate (TOPROL-XL) 25 MG 24 hr tablet Take 1 tablet (25 mg total) by mouth daily. 90 tablet 3  . Omega-3 Fatty Acids (FISH OIL CONCENTRATE PO) Take 2 capsules by mouth daily.      No current facility-administered medications for this visit.     Allergies:   Sulfonamide derivatives    ROS:  Please see the history of present illness.   Otherwise, review of systems are positive for none.   All other systems are reviewed and negative.    PHYSICAL EXAM: VS:  BP 112/72   Pulse 64   Ht 6\' 1"  (1.854 m)   Wt 274 lb (124.3 kg)   BMI 36.15 kg/m  , BMI Body mass index is 36.15 kg/m.  GENERAL:  Well appearing NECK:  No jugular venous distention, waveform within normal limits, carotid upstroke brisk and symmetric, no bruits, no thyromegaly LUNGS:  Clear to auscultation bilaterally CHEST:  Unremarkable HEART:  PMI not displaced or sustained,S1 and S2 within normal limits, no S3, no S4, no clicks, no rubs, no murmurs ABD:  Flat, positive bowel sounds normal in frequency in  pitch, no bruits, no rebound, no guarding, no midline pulsatile mass, no hepatomegaly, no splenomegaly EXT:  2 plus pulses throughout, trace edema, no cyanosis no clubbing   EKG:  EKG is ordered today. The ekg ordered 02/15/16 demonstrates NSR, rate 64, RBBB, no acute ST T wave changes.     Recent Labs: 12/26/2016: ALT 25; BUN 27; Creatinine, Ser 0.74; Potassium 4.3; Sodium 137    Lipid Panel    Component Value Date/Time   CHOL 197 08/08/2016 1012   TRIG 229.0 (H) 08/08/2016 1012   HDL 61.00 08/08/2016 1012   CHOLHDL 3 08/08/2016 1012   VLDL 45.8 (H) 08/08/2016 1012   LDLDIRECT 96.0 08/08/2016 1012      Wt Readings from Last 3 Encounters:  03/28/17 274 lb (124.3 kg)  03/20/17 277 lb (125.6 kg)  12/19/16 288 lb (130.6 kg)      Other studies  Reviewed: Additional studies/ records that were reviewed today include:  Labs Review of the above records demonstrates:     ASSESSMENT AND PLAN:  RBBB:  This is chronic.  No change in therapy.     I don't suspect structural heart disease.  However, with his affinity for diving I want to make sure he has a structurally normal heart including right heart pressures.  I will obtain an echo.  ELEVATED CORONARY CALCIUM:  There was no ischemia on POET (Plain Old Exercise Treadmill).  He will continue with risk reduction.   OBESITY: I am prior to his weight loss and encouraged more of the same.  DM: His A1c fell from 10.6 to 9.4 to 7.2 and now is 6.2 we will continue with current therapy.  DYSLIPIDEMIA: LDL is down from a peak of 221 and is now 96.  He will continue with current therapy.  HTN:  BP is at target.   Current medicines are reviewed at length with the patient today.  The patient does not have concerns regarding medicines.  The following changes have been made:   None  Labs/ tests ordered today include:  None  Orders Placed This Encounter  Procedures  . EKG 12-Lead     Disposition:   FU with me as needed.     Signed, Minus Breeding, MD  03/28/2017 9:09 AM    [place]

## 2017-03-28 ENCOUNTER — Ambulatory Visit (INDEPENDENT_AMBULATORY_CARE_PROVIDER_SITE_OTHER): Payer: PRIVATE HEALTH INSURANCE | Admitting: Cardiology

## 2017-03-28 ENCOUNTER — Encounter: Payer: Self-pay | Admitting: Cardiology

## 2017-03-28 VITALS — BP 112/72 | HR 64 | Ht 73.0 in | Wt 274.0 lb

## 2017-03-28 DIAGNOSIS — R9431 Abnormal electrocardiogram [ECG] [EKG]: Secondary | ICD-10-CM | POA: Diagnosis not present

## 2017-03-28 DIAGNOSIS — R931 Abnormal findings on diagnostic imaging of heart and coronary circulation: Secondary | ICD-10-CM

## 2017-03-28 DIAGNOSIS — I10 Essential (primary) hypertension: Secondary | ICD-10-CM

## 2017-03-28 DIAGNOSIS — E785 Hyperlipidemia, unspecified: Secondary | ICD-10-CM

## 2017-03-28 NOTE — Patient Instructions (Signed)
Medication Instructions:  Continue current medications  If you need a refill on your cardiac medications before your next appointment, please call your pharmacy.  Labwork: None Ordered  Testing/Procedures: None Ordered  Follow-Up: Your physician wants you to follow-up in: As Needed.      Thank you for choosing [facility]!!       

## 2017-05-17 ENCOUNTER — Encounter: Payer: Self-pay | Admitting: Internal Medicine

## 2017-05-17 MED ORDER — FOSINOPRIL SODIUM 10 MG PO TABS: 10.0000 mg | ORAL_TABLET | Freq: Every day | ORAL | 0 refills | Status: DC

## 2017-05-17 MED ORDER — ALLOPURINOL 100 MG PO TABS: 100.0000 mg | ORAL_TABLET | Freq: Two times a day (BID) | ORAL | 0 refills | Status: AC

## 2017-05-17 MED ORDER — DAPAGLIFLOZIN PRO-METFORMIN ER 5-1000 MG PO TB24: 1.0000 | ORAL_TABLET | Freq: Two times a day (BID) | ORAL | 0 refills | Status: DC

## 2017-05-17 MED ORDER — METOPROLOL SUCCINATE ER 25 MG PO TB24: 25.0000 mg | ORAL_TABLET | Freq: Every day | ORAL | 0 refills | Status: DC

## 2017-05-17 MED ORDER — ATORVASTATIN CALCIUM 40 MG PO TABS: 40.0000 mg | ORAL_TABLET | Freq: Every day | ORAL | 0 refills | Status: AC

## 2017-05-20 ENCOUNTER — Encounter: Payer: Self-pay | Admitting: Internal Medicine

## 2017-05-21 NOTE — Telephone Encounter (Signed)
will proceed w/PA checking on PA form.Marland KitchenJohny Baker

## 2017-05-22 NOTE — Telephone Encounter (Signed)
Called [facility] they faxed over PA. Completed form and have faxed back to insurance waiting on approval status.Marland KitchenJohny Baker

## 2017-05-27 NOTE — Telephone Encounter (Signed)
[facility]ed [facility] spoke w/rep verified status on PA. Per rep PA had been approved w/dates 04/22/17-2/31/2099 (lifetime). Approval letter and fax has been sent to provider and patient.Marland KitchenJohny Chess

## 2017-06-11 DIAGNOSIS — D171 Benign lipomatous neoplasm of skin and subcutaneous tissue of trunk: Secondary | ICD-10-CM | POA: Insufficient documentation

## 2017-06-13 ENCOUNTER — Encounter: Payer: Self-pay | Admitting: Internal Medicine

## 2017-06-23 ENCOUNTER — Encounter: Payer: Self-pay | Admitting: Internal Medicine

## 2017-07-03 ENCOUNTER — Ambulatory Visit (HOSPITAL_BASED_OUTPATIENT_CLINIC_OR_DEPARTMENT_OTHER): Admit: 2017-07-03 | Admitting: Plastic Surgery

## 2017-07-03 ENCOUNTER — Encounter (HOSPITAL_BASED_OUTPATIENT_CLINIC_OR_DEPARTMENT_OTHER): Payer: Self-pay

## 2017-07-03 SURGERY — EXCISION LIPOMA
Anesthesia: General | Site: Back

## 2017-11-12 IMAGING — MR MR SHOULDER*L* W/O CM
4 of 5 series · 14 of 40 positions shown · non-contrast
Comparison: None.

CLINICAL DATA: Left shoulder pain with limited range of motion for
7 months. Progressively worsening.

EXAM:
MRI OF THE LEFT SHOULDER WITHOUT CONTRAST
TECHNIQUE: Multiplanar, multisequence MR imaging of the shoulder was performed.
No intravenous contrast was administered.

[Series 6: T2 fat-sat · axial · left · 3.0mm · 0.44mm/px · z∈[-75,-12]mm · 3 of 25 slices shown (1 of 3)]
[im 3/25]
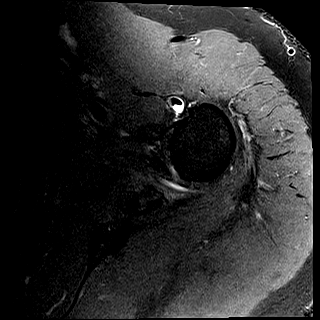
[im 14/25]
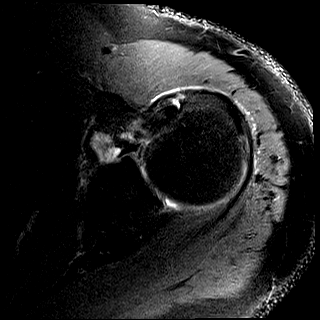
[im 22/25]
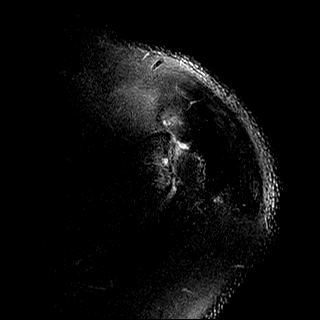

[Series 7: T2 fat-sat · oblique · left · 3.0mm · 0.44mm/px · 3 of 21 slices shown (2 of 3)]
[im 4/21]
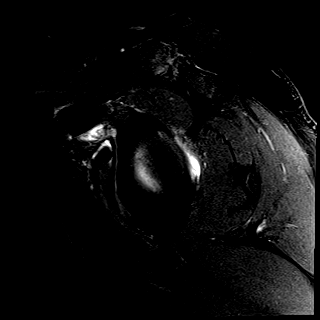
[im 11/21]
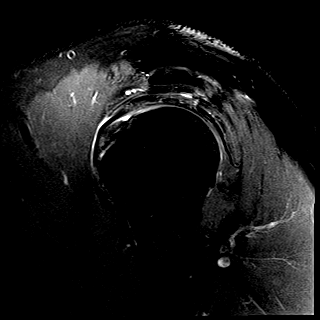
[im 17/21]
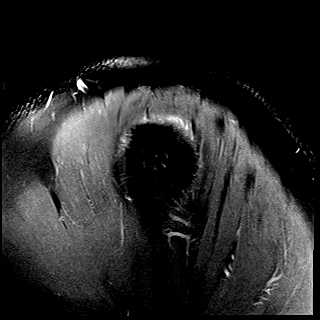

[Series 10: PD · oblique · left · 3.0mm · 0.18mm/px · 5 of 24 slices shown]
[im 1/24]
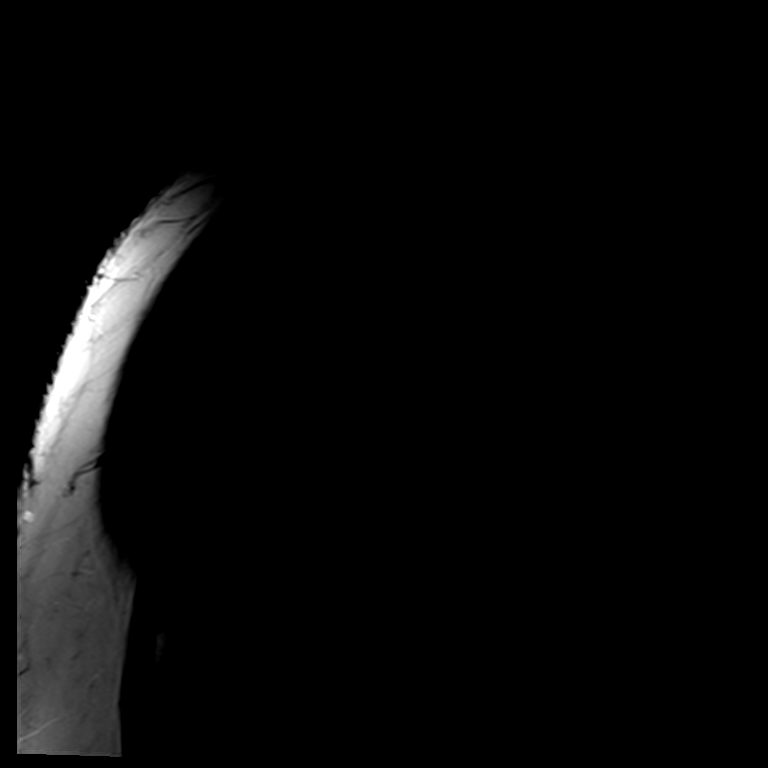
[im 4/24]
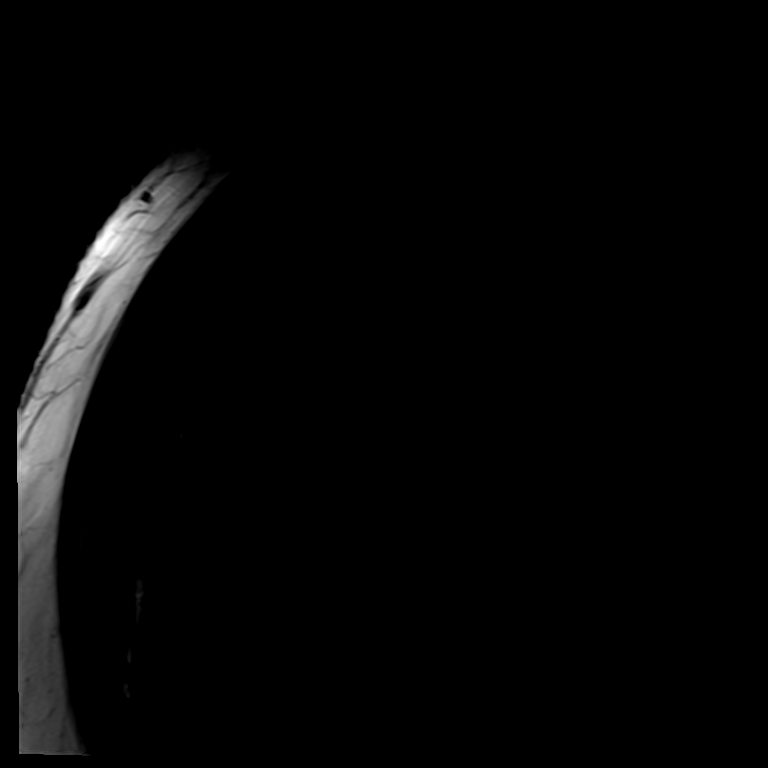
[im 7/24]
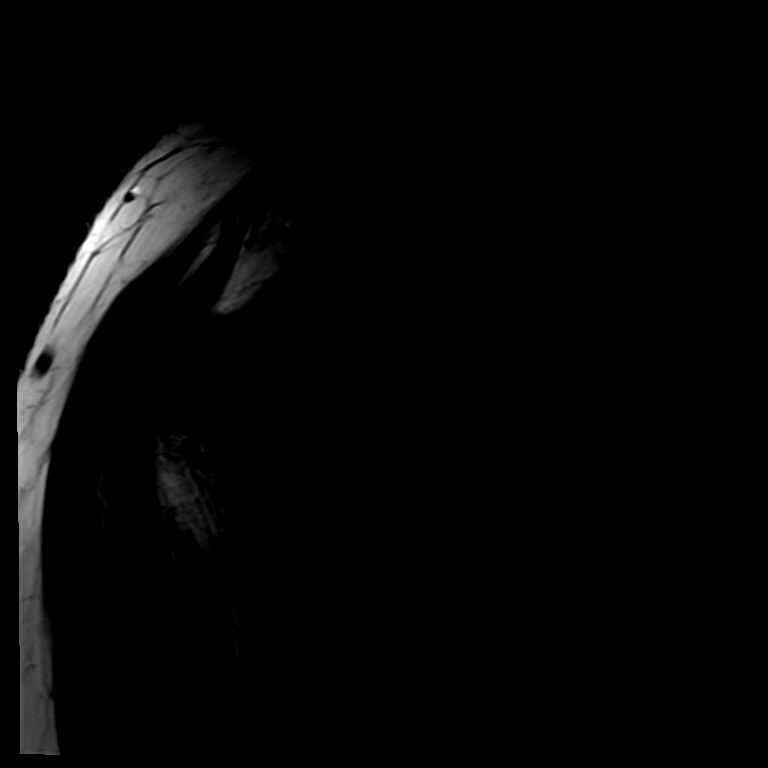
[im 14/24]
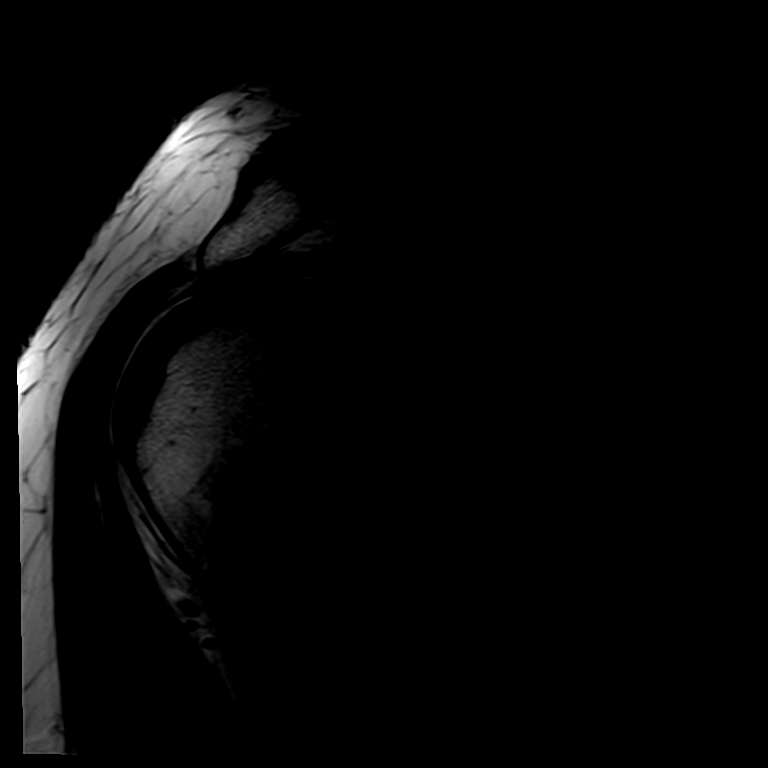
[im 20/24]
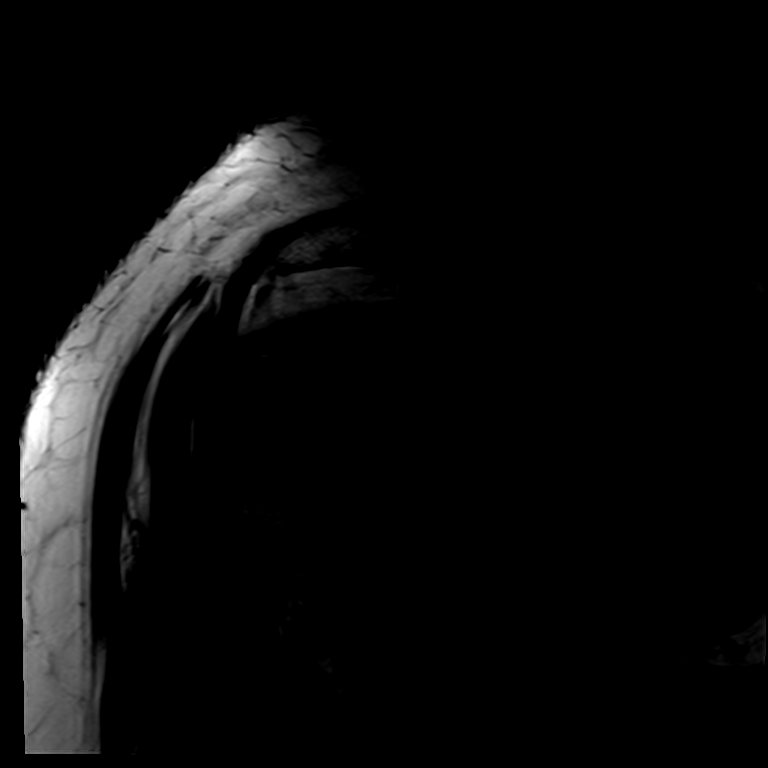

[Series 11: T2 fat-sat · oblique · left · 3.0mm · 0.22mm/px · 3 of 24 slices shown (3 of 3)]
[im 4/24]
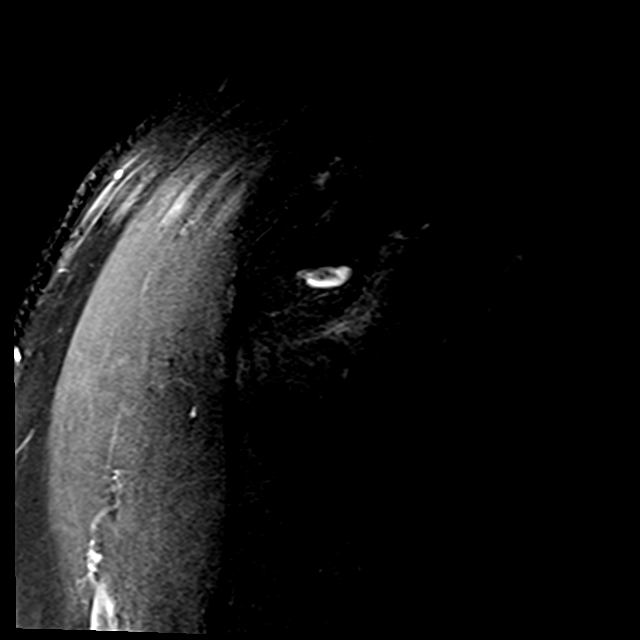
[im 14/24]
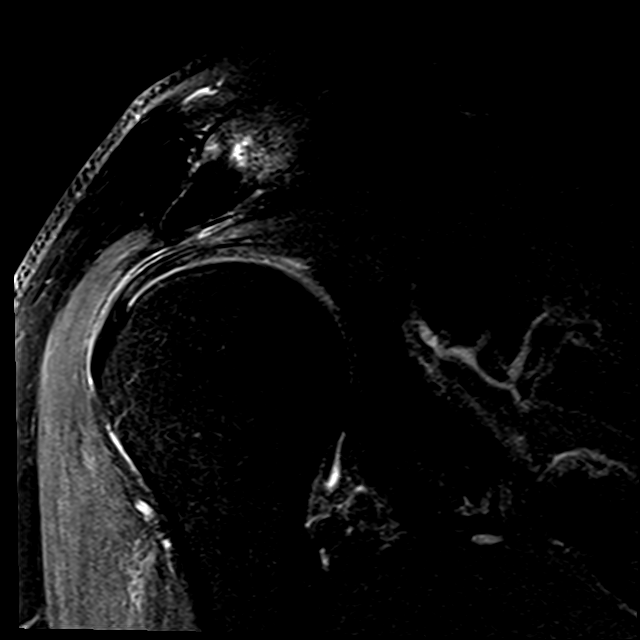
[im 20/24]
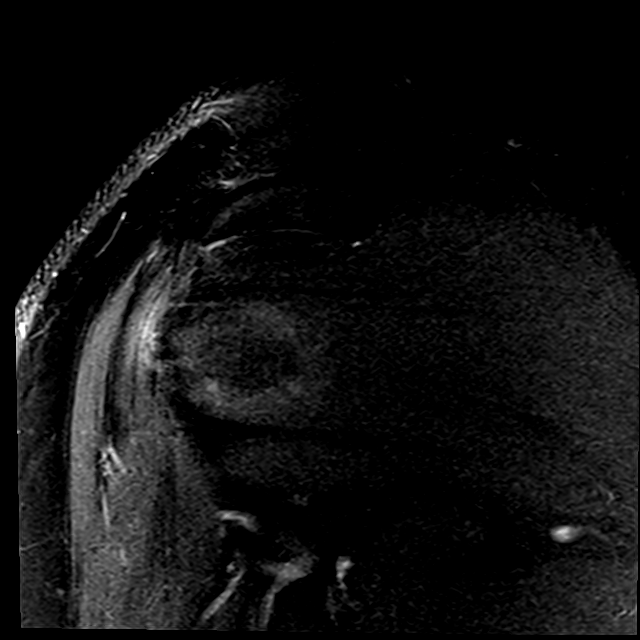

[14 of 40 positions shown; findings below may reference images not displayed]

FINDINGS: Rotator cuff: Moderate tendinosis of the supraspinatus tendon with a
small partial-thickness articular surface tear of the anterior
fibers. Mild tendinosis of the infraspinatus tendon. Teres minor
tendon is intact. Subscapularis tendon is intact.

Muscles: No atrophy or fatty replacement of nor abnormal signal
within, the muscles of the rotator cuff.

Biceps long head: Mild tendinosis of the intraarticular portion of
the long head of the biceps tendon.

Acromioclavicular Joint: Moderate arthropathy of the
acromioclavicular joint. Type II acromion. Trace
subacromial/subdeltoid bursal fluid.

Glenohumeral Joint: No joint effusion. No chondral defect.

Labrum: Grossly intact, but evaluation is limited by lack of
intraarticular fluid.

Bones:  No marrow abnormality, fracture or dislocation.

Other: None.
IMPRESSION: 1. Moderate tendinosis of the supraspinatus tendon with a small
partial-thickness articular surface tear of the anterior fibers.
2. Mild tendinosis of the infraspinatus tendon.
3. Mild tendinosis of the intraarticular portion of the long head of
the biceps tendon.

## 2020-08-24 ENCOUNTER — Encounter: Payer: Self-pay | Admitting: Radiation Oncology

## 2020-08-24 DIAGNOSIS — E785 Hyperlipidemia, unspecified: Secondary | ICD-10-CM | POA: Insufficient documentation

## 2020-08-24 DIAGNOSIS — E119 Type 2 diabetes mellitus without complications: Secondary | ICD-10-CM | POA: Insufficient documentation

## 2020-08-24 NOTE — Progress Notes (Signed)
GU Location of Tumor / Histology: prostatic adenocarcinoma  If Prostate Cancer, Gleason Score is (3 + 4) and PSA is (5.9). Prostate volume: 54.93  Biopsies of prostate (if applicable) revealed:    Past/Anticipated interventions by urology, if any: prostate biopsy, prescribed alfuzosin, referral to Dr. Tammi Klippel to discuss radiation options.  Past/Anticipated interventions by medical oncology, if any: no  Weight changes, if any: denies  Bowel/Bladder complaints, if any: IPSS 16. SHIM 2. Reports LUTS greatly improved with Alfuzosin. Denies dysuria or hematuria. Reports post void dribble. Denies diarrhea, nausea, vomiting or constipation.    Nausea/Vomiting, if any: denies  Pain issues, if any:  Denies new pain. Reports unchanged bilateral chronic knee pain.   SAFETY ISSUES:  Prior radiation? No  Pacemaker/ICD? No  Possible current pregnancy? no, male patient  Is the patient on methotrexate? no  Current Complaints / other details:  60 year old male. Married to [place] with 1 child. Resides in [place]. Stopped smoking in 1998.

## 2020-08-25 ENCOUNTER — Ambulatory Visit
Admission: RE | Admit: 2020-08-25 | Discharge: 2020-08-25 | Disposition: A | Discharge status: Home / Self Care | Source: Ambulatory Visit | Attending: Radiation Oncology | Admitting: Radiation Oncology

## 2020-08-25 ENCOUNTER — Other Ambulatory Visit: Payer: Self-pay

## 2020-08-25 ENCOUNTER — Encounter: Payer: Self-pay | Admitting: Radiation Oncology

## 2020-08-25 VITALS — Ht 73.0 in | Wt 294.0 lb

## 2020-08-25 DIAGNOSIS — C61 Malignant neoplasm of prostate: Secondary | ICD-10-CM

## 2020-08-25 HISTORY — DX: Malignant neoplasm of prostate: C61

## 2020-08-26 DIAGNOSIS — C61 Malignant neoplasm of prostate: Secondary | ICD-10-CM | POA: Insufficient documentation

## 2020-08-26 NOTE — Progress Notes (Signed)
Radiation Oncology         [phone]) [phone] ________________________________  Initial outpatient Consultation  Conducted via video teleconference with face-to-face on camera visit.  Name: [name] MRN: [id number]  Date: 08/25/2020  DOB: [birth year]  IR:SWNIOEVOJ, Randall Lacks, MD  Randall Bring, MD   REFERRING PHYSICIAN: Raynelle Bring, MD  DIAGNOSIS: 60 y.o. gentleman with Stage T1c adenocarcinoma of the prostate with Gleason score of 3+4, and PSA of 5.9.    ICD-10-CM   1. Malignant neoplasm of prostate ([place])  C61     HISTORY OF PRESENT ILLNESS: [name] is a 60 y.o. male with a diagnosis of prostate cancer. He was noted to have an elevated PSA of 5.9 by his primary care physician.  Accordingly, he was referred for evaluation in urology by Dr. Lexine Baker [facility]),  digital rectal examination was performed at that time revealing no concerning findings or nodules.  The patient proceeded to transrectal ultrasound with 12 biopsies of the prostate on 02/11/20.  The prostate volume measured 54.93 cc.  Out of 12 core biopsies, 3 were positive.  The maximum Gleason score was 3+4, and this was seen in the right base, right mid and right mid lateral.  The patient reviewed the biopsy results with his urologist and he has kindly been referred today for discussion of potential radiation treatment options.  Although he and his wife live on [place], [place], they are from [place].  He met with Dr. Alinda Baker on 08/02/2020 for second opinion and discussion regarding surgical options.   PREVIOUS RADIATION THERAPY: No  PAST MEDICAL HISTORY:  Past Medical History:  Diagnosis Date  . Achilles tendonitis   . Arthritis    lower back  . Diabetes ([place])   . H/O hiatal hernia   . Hypertension   . Lower back pain   . Prostate cancer ([place])   . Sprain and strain of calcaneofibular ligament       PAST SURGICAL HISTORY: Past Surgical History:  Procedure Laterality Date  . COLONOSCOPY N/A  07/14/2013   Procedure: COLONOSCOPY;  Surgeon: Garlan Fair, MD;  Location: [facility];  Service: Endoscopy;  Laterality: N/A;  . PROSTATE BIOPSY      FAMILY HISTORY:  Family History  Problem Relation Age of Onset  . Heart disease Mother        Pacemaker  . Cancer Father 37       lung cancer  . Lung cancer Father   . Cancer Brother        lung ca  . Lung cancer Brother   . Cancer Brother 72       prostate ca  . Prostate cancer Brother   . Heart attack Maternal Uncle   . Stroke Maternal Grandmother   . Cancer Paternal Aunt        type unknown "possibly breast"  . Lung cancer Paternal Uncle   . Breast cancer Neg Hx   . Colon cancer Neg Hx   . Pancreatic cancer Neg Hx     SOCIAL HISTORY:  Social History   Socioeconomic History  . Marital status: Married    Spouse name: [name]  . Number of children: 1  . Years of education: Not on file  . Highest education level: Not on file  Occupational History  . Not on file  Tobacco Use  . Smoking status: Former Smoker    Packs/day: 1.00    Years: 20.00    Pack years: 20.00    Types: Cigarettes  Quit date: 07/08/1996    Years since quitting: 24.1  . Smokeless tobacco: Former Systems developer    Types: Chew  . Tobacco comment: Quit 1998  Vaping Use  . Vaping Use: Never used  Substance and Sexual Activity  . Alcohol use: Yes    Alcohol/week: 0.0 standard drinks  . Drug use: No  . Sexual activity: Yes  Other Topics Concern  . Not on file  Social History Narrative  . Not on file   Social Determinants of Health   Financial Resource Strain: Not on file  Food Insecurity: Not on file  Transportation Needs: Not on file  Physical Activity: Not on file  Stress: Not on file  Social Connections: Not on file  Intimate Partner Violence: Not on file    ALLERGIES: Sulfonamide derivatives  MEDICATIONS:  Current Outpatient Medications  Medication Sig Dispense Refill  . alfuzosin (UROXATRAL) 10 MG 24 hr tablet Take 10 mg by mouth  daily with breakfast.    . allopurinol (ZYLOPRIM) 100 MG tablet Take 1 tablet (100 mg total) by mouth 2 (two) times daily. Annual appt due in May must see provider for future refills 180 tablet 0  . ASPIRIN 81 PO Take by mouth.    Marland Kitchen atorvastatin (LIPITOR) 40 MG tablet Take 1 tablet (40 mg total) by mouth daily. Annual appt due in May must see provider for future refills 90 tablet 0  . Cholecalciferol 25 MCG (1000 UT) tablet Take by mouth.    . Dapagliflozin-Metformin HCl ER (XIGDUO XR) 08-998 MG TB24 Take 1 tablet by mouth 2 (two) times daily. Annual appt due in May must see provider for future refills 180 tablet 0  . glucose blood test strip Use as instructed. DX E11.65 100 each 12  . Lancets (ACCU-CHEK MULTICLIX) lancets Use as instructed. DX: E11.65 102 each 11  . lisinopril (ZESTRIL) 20 MG tablet Take by mouth.    . metoprolol succinate (TOPROL-XL) 25 MG 24 hr tablet Take 1 tablet (25 mg total) by mouth daily. Annual appt due in May must see provider for future refills 90 tablet 0  . Omega-3 Fatty Acids (FISH OIL CONCENTRATE PO) Take 2 capsules by mouth daily.     No current facility-administered medications for this encounter.    REVIEW OF SYSTEMS:  On review of systems, the patient reports that he is doing well overall. He denies any chest pain, shortness of breath, cough, fevers, chills, night sweats, unintended weight changes. He denies any bowel disturbances, and denies abdominal pain, nausea or vomiting. He denies any new musculoskeletal or joint aches or pains. His IPSS was 16, indicating moderate urinary symptoms despite taking Uroxatrol daily as prescribed. His SHIM was 25, indicating he does not have erectile dysfunction. A complete review of systems is obtained and is otherwise negative.    PHYSICAL EXAM:  Wt Readings from Last 3 Encounters:  08/25/20 294 lb (133.4 kg)  03/28/17 274 lb (124.3 kg)  03/20/17 277 lb (125.6 kg)   Temp Readings from Last 3 Encounters:  03/20/17  97.9 F (36.6 C) (Oral)  12/19/16 98.2 F (36.8 C) (Oral)  08/08/16 97.7 F (36.5 C) (Oral)   BP Readings from Last 3 Encounters:  03/28/17 112/72  03/20/17 118/82  12/19/16 122/78   Pulse Readings from Last 3 Encounters:  03/28/17 64  03/20/17 79  12/19/16 70   Pain Assessment Pain Score: 0-No pain (Denies new pain. Reports chronic bilateral knee pain.)/10  In general this is a well appearing Caucasian male  in no acute distress. He's alert and oriented x4 and appropriate throughout the examination. Cardiopulmonary assessment is negative for acute distress, and he exhibits normal effort.     KPS = 100  100 - Normal; no complaints; no evidence of disease. 90   - Able to carry on normal activity; minor signs or symptoms of disease. 80   - Normal activity with effort; some signs or symptoms of disease. 79   - Cares for self; unable to carry on normal activity or to do active work. 60   - Requires occasional assistance, but is able to care for most of his personal needs. 50   - Requires considerable assistance and frequent medical care. 20   - Disabled; requires special care and assistance. 81   - Severely disabled; hospital admission is indicated although death not imminent. 46   - Very sick; hospital admission necessary; active supportive treatment necessary. 10   - Moribund; fatal processes progressing rapidly. 0     - Dead  Karnofsky DA, Abelmann [place], Craver LS and Burchenal [facility] [phone]) The use of the nitrogen mustards in the palliative treatment of carcinoma: with particular reference to bronchogenic carcinoma Cancer 1 [phone]  LABORATORY DATA:  Lab Results  Component Value Date   WBC 6.3 02/16/2016   HGB 16.6 02/16/2016   HCT 48.4 02/16/2016   MCV 87.7 02/16/2016   PLT 246.0 02/16/2016   Lab Results  Component Value Date   NA 137 12/26/2016   K 4.3 12/26/2016   CL 103 12/26/2016   CO2 26 12/26/2016   Lab Results  Component Value Date   ALT 25 12/26/2016   AST  15 12/26/2016   ALKPHOS 60 12/26/2016   BILITOT 0.6 12/26/2016     RADIOGRAPHY: No results found.    IMPRESSION/PLAN: 1. 60 y.o. gentleman with Stage T1c adenocarcinoma of the prostate with Gleason Score of 3+4, and PSA of 5.9. We discussed the patient's workup and outlined the nature of prostate cancer in this setting. The patient's T stage, Gleason's score, and PSA put him into the favorable intermediate risk group. Accordingly, he is eligible for a variety of potential treatment options including brachytherapy, 5.5 weeks of external radiation or prostatectomy. We discussed the available radiation techniques, and focused on the details and logistics of delivery. The patient may not be an ideal candidate for brachytherapy given the fact that he has moderate LUTS with a IPSS score of 16 despite taking Uroxatrol daily. We discussed that a brachytherapy procedure would almost certainly significantly increase his LUTS and could potentially negatively impact his quality of life as opposed to alternative treatments such as prostatectomy or external beam radiotherapy which would have much fewer side effects in regards to urinary obstruction from prostate swelling.  We discussed and outlined the risks, benefits, short and long-term effects associated with radiotherapy and compared and contrasted these with prostatectomy. We discussed the role of SpaceOAR gel in reducing the rectal toxicity associated with radiotherapy.  He appears to have a good understanding of his disease and our treatment recommendations which are of curative intent.  He was encouraged to ask questions that were answered to his stated satisfaction.  At the conclusion of our conversation, the patient remains undecided regarding his treatment preference and prefers to take some additional time to consider his options.  He appears to be leaning towards either robotic prostatectomy or brachytherapy.  He has our contact information and will let us  know if he would like to proceed with  radiation.  We did also discuss the potential for referral to a radiation oncologist in the [place] area should he prefer to have radiation treatments closer to home.  We enjoyed meeting him and his wife today and look forward to continuing to follow his progress and potentially participating in his care.  He knows that he is welcome to call at anytime with any further questions or concerns regarding radiation.  We personally spent 75 minutes in this encounter including chart review, reviewing radiological studies, meeting face-to-face with the patient, entering orders and completing documentation.  This was a video visit conducted via [place].  Dr. Tammi Klippel and Ailene Ards were at the [facility] while the patient was at home on [facility], PA-C    Tyler Pita, [place]: [phone]  Fax: [phone] [email or web address]  Skype  LinkedIn

## 2020-09-02 ENCOUNTER — Telehealth: Payer: Self-pay | Admitting: Radiation Oncology

## 2020-09-02 NOTE — Telephone Encounter (Signed)
Received two voicemail messages from patient's significant other, Bethena Roys, requesting a return call. Phoned back to inquire. Spoke with both Bethena Roys and Dellis Filbert on speaker phone. Bethena Roys verbalizes that her husband would like to pursue brachytherapy. Bethena Roys explains that she will be in [place] August 19-28 and would prefer the procedure be done before or after her trip. She wants to be available to support her husband. Explained this RN will relay these findings to the providers and our scheduler, Romie Jumper, will be in touch. Provided my direct number for further question or concerns.Both verbalized understanding of all reviewed and appreciation for the return call.

## 2020-09-19 ENCOUNTER — Other Ambulatory Visit: Payer: Self-pay | Admitting: Urology

## 2020-09-19 ENCOUNTER — Telehealth: Payer: Self-pay | Admitting: *Deleted

## 2020-09-19 NOTE — Telephone Encounter (Signed)
CALLED PATIENT TO VERIFY THAT HE IS HAVING A PROSTATECTOMY, SPOKE WITH PATIENT AND HE DID VERIFY THAT HE IS HAVING A PROSTATECTOMY

## 2020-09-20 ENCOUNTER — Telehealth: Payer: Self-pay | Admitting: *Deleted

## 2020-09-20 NOTE — Telephone Encounter (Signed)
PATIENT DECIDED TO HAVE PROSTATECTOMY

## 2020-10-13 NOTE — Progress Notes (Addendum)
DUE TO COVID-19 ONLY ONE VISITOR IS ALLOWED TO COME WITH YOU AND STAY IN THE WAITING ROOM ONLY DURING PRE OP AND PROCEDURE DAY OF SURGERY. THE 1 VISITOR  MAY VISIT WITH YOU AFTER SURGERY IN YOUR PRIVATE ROOM DURING VISITING HOURS ONLY!  YOU NEED TO HAVE A COVID 19 TEST ON___7/03/2021 ____ @___1130am  ____, THIS TEST MUST BE DONE BEFORE SURGERY,  COVID TESTING SITE [address] [place] [address], IT IS ON THE RIGHT GOING OUT [address] APPROXIMATELY  2 MINUTES PAST [employer] ON THE RIGHT. ONCE YOUR COVID TEST IS COMPLETED,  PLEASE BEGIN THE QUARANTINE INSTRUCTIONS AS OUTLINED IN YOUR HANDOUT.                [name]  10/13/2020   Your procedure is scheduled on:        10/20/2020   Report to [facility] Main  Entrance   Report to admitting at     [phone]     Call this number if you have problems the morning of surgery [phone]    Remember: Do not eat food , candy gum or mints :After Midnight. You may have clear liquids from midnight until    0415am   Magnesium citrate by 12 noon day before surgery.   Fleets enema nite before surgery  CLEAR LIQUID DIET   Foods Allowed                                                                       Coffee and tea, regular and decaf                              Plain Jell-O any favor except red or purple                                            Fruit ices (not with fruit pulp)                                      Iced Popsicles                                     Carbonated beverages, regular and diet                                    Cranberry, grape and apple juices Sports drinks like Gatorade Lightly seasoned clear broth or consume(fat free) Sugar, honey syrup  _____________________________________________________________________    BRUSH YOUR TEETH MORNING OF SURGERY AND RINSE YOUR MOUTH OUT, NO CHEWING GUM CANDY OR MINTS.     Take these medicines the morning of surgery with A SIP OF WATER:    uroxatral, allopurinol, toprol   DO NOT TAKE ANY DIABETIC MEDICATIONS DAY OF YOUR SURGERY  You may not have any metal on your body including hair pins and              piercings  Do not wear jewelry, make-up, lotions, powders or perfumes, deodorant             Do not wear nail polish on your fingernails.  Do not shave  48 hours prior to surgery.              Men may shave face and neck.   Do not bring valuables to the hospital. [place].  Contacts, dentures or bridgework may not be worn into surgery.  Leave suitcase in the car. After surgery it may be brought to your room.     Patients discharged the day of surgery will not be allowed to drive home. IF YOU ARE HAVING SURGERY AND GOING HOME THE SAME DAY, YOU MUST HAVE AN ADULT TO DRIVE YOU HOME AND BE WITH YOU FOR 24 HOURS. YOU MAY GO HOME BY TAXI OR UBER OR ORTHERWISE, BUT AN ADULT MUST ACCOMPANY YOU HOME AND STAY WITH YOU FOR 24 HOURS.  Name and phone number of your driver:  Special Instructions: N/A              Please read over the following fact sheets you were given: _____________________________________________________________________  [facility] - Preparing for Surgery Before surgery, you can play an important role.  Because skin is not sterile, your skin needs to be as free of germs as possible.  You can reduce the number of germs on your skin by washing with CHG (chlorahexidine gluconate) soap before surgery.  CHG is an antiseptic cleaner which kills germs and bonds with the skin to continue killing germs even after washing. Please DO NOT use if you have an allergy to CHG or antibacterial soaps.  If your skin becomes reddened/irritated stop using the CHG and inform your nurse when you arrive at Short Stay. Do not shave (including legs and underarms) for at least 48 hours prior to the first CHG shower.  You may shave your face/neck. Please follow these  instructions carefully:  1.  Shower with CHG Soap the night before surgery and the  morning of Surgery.  2.  If you choose to wash your hair, wash your hair first as usual with your  normal  shampoo.  3.  After you shampoo, rinse your hair and body thoroughly to remove the  shampoo.                           4.  Use CHG as you would any other liquid soap.  You can apply chg directly  to the skin and wash                       Gently with a scrungie or clean washcloth.  5.  Apply the CHG Soap to your body ONLY FROM THE NECK DOWN.   Do not use on face/ open                           Wound or open sores. Avoid contact with eyes, ears mouth and genitals (private parts).  Wash face,  Genitals (private parts) with your normal soap.             6.  Wash thoroughly, paying special attention to the area where your surgery  will be performed.  7.  Thoroughly rinse your body with warm water from the neck down.  8.  DO NOT shower/wash with your normal soap after using and rinsing off  the CHG Soap.                9.  Pat yourself dry with a clean towel.            10.  Wear clean pajamas.            11.  Place clean sheets on your bed the night of your first shower and do not  sleep with pets. Day of Surgery : Do not apply any lotions/deodorants the morning of surgery.  Please wear clean clothes to the hospital/surgery center.  FAILURE TO FOLLOW THESE INSTRUCTIONS MAY RESULT IN THE CANCELLATION OF YOUR SURGERY PATIENT SIGNATURE_________________________________  NURSE SIGNATURE__________________________________  ________________________________________________________________________

## 2020-10-18 ENCOUNTER — Encounter (HOSPITAL_COMMUNITY): Payer: Self-pay

## 2020-10-18 ENCOUNTER — Other Ambulatory Visit (HOSPITAL_COMMUNITY)
Admission: RE | Admit: 2020-10-18 | Discharge: 2020-10-18 | Disposition: A | Discharge status: Home / Self Care | Source: Ambulatory Visit | Attending: Urology | Admitting: Urology

## 2020-10-18 ENCOUNTER — Encounter (HOSPITAL_COMMUNITY)
Admission: RE | Admit: 2020-10-18 | Discharge: 2020-10-18 | Disposition: A | Discharge status: Home / Self Care | Source: Ambulatory Visit | Attending: Urology | Admitting: Urology

## 2020-10-18 ENCOUNTER — Other Ambulatory Visit: Payer: Self-pay

## 2020-10-18 DIAGNOSIS — Z20822 Contact with and (suspected) exposure to covid-19: Secondary | ICD-10-CM | POA: Insufficient documentation

## 2020-10-18 DIAGNOSIS — Z01818 Encounter for other preprocedural examination: Secondary | ICD-10-CM | POA: Insufficient documentation

## 2020-10-18 DIAGNOSIS — Z01812 Encounter for preprocedural laboratory examination: Secondary | ICD-10-CM | POA: Insufficient documentation

## 2020-10-18 DIAGNOSIS — I4891 Unspecified atrial fibrillation: Secondary | ICD-10-CM

## 2020-10-18 HISTORY — DX: Acute embolism and thrombosis of unspecified deep veins of unspecified lower extremity: I82.409

## 2020-10-18 HISTORY — DX: Unspecified atrial fibrillation: I48.91

## 2020-10-18 LAB — BASIC METABOLIC PANEL
Anion gap: 7 (ref 5–15)
BUN: 34 mg/dL — ABNORMAL HIGH (ref 8–23)
CO2: 24 mmol/L (ref 22–32)
Calcium: 9.7 mg/dL (ref 8.9–10.3)
Chloride: 109 mmol/L (ref 98–111)
Creatinine, Ser: 1.12 mg/dL (ref 0.61–1.24)
GFR, Estimated: >60 mL/min (ref >60)
Glucose, Bld: 116 mg/dL — ABNORMAL HIGH (ref 70–99)
Potassium: 4.4 mmol/L (ref 3.5–5.1)
Sodium: 140 mmol/L (ref 135–145)

## 2020-10-18 LAB — CBC
HCT: 45.6 % (ref 39.0–52.0)
Hemoglobin: 15.2 g/dL (ref 13.0–17.0)
MCH: 30.8 pg (ref 26.0–34.0)
MCHC: 33.3 g/dL (ref 30.0–36.0)
MCV: 92.5 fL (ref 80.0–100.0)
Platelets: 172 10*3/uL (ref 150–400)
RBC: 4.93 MIL/uL (ref 4.22–5.81)
RDW: 15.9 % — ABNORMAL HIGH (ref 11.5–15.5)
WBC: 5.5 10*3/uL (ref 4.0–10.5)
nRBC: 0 % (ref 0.0–0.2)

## 2020-10-18 LAB — GLUCOSE, CAPILLARY: Glucose-Capillary: 113 mg/dL — ABNORMAL HIGH (ref 70–99)

## 2020-10-18 LAB — HEMOGLOBIN A1C
Hgb A1c MFr Bld: 5.8 % — ABNORMAL HIGH (ref 4.8–5.6)
Mean Plasma Glucose: 119.76 mg/dL

## 2020-10-18 LAB — SARS CORONAVIRUS 2 (TAT 6-24 HRS): SARS Coronavirus 2: NEGATIVE

## 2020-10-18 NOTE — Progress Notes (Addendum)
Anesthesia Review:  PCP: DR Haze Boyden in [place]  Cardiologist : not followed by a cardiologist , [place] DR Hochrein- 2018  Chest x-ray : EKG : 10/18/20- per note on ekg Jessica Zanetto,PAC aware  Final EKG 10/18/20- in epic  No ekg done by DR Haze Boyden per pt  Ct Card- 2017  Echo : 2017  Stress test: 2017  Cardiac Cath :  Activity level: cannot do a flight of stairs without difficulty  Sleep Study/ CPAP : none  Fasting Blood Sugar :      / Checks Blood Sugar -- times a day:   Blood Thinner/ Instructions /Last Dose: ASA / Instructions/ Last Dose :   DM- type 2  Checks glucose every 3-4 weeks  81 mg aspirin  Hgba1c-10/18/20-5.8

## 2020-10-20 LAB — TYPE AND SCREEN
ABO/RH(D): A POS
Antibody Screen: NEGATIVE

## 2020-10-21 NOTE — Progress Notes (Signed)
Cardiology Office Note:    Date:  10/24/2020   ID:  [name], DOB [birth year], MRN [id number]  PCP:  Pcp, No  Cardiologist:  None  Electrophysiologist:  None   Referring MD: No ref. provider found   History of Present Illness:    [name] is a 60 y.o. male with a hx of CAD, T2DM, hypertension, hyperlipidemia, recently diagnosed atrial fibrillation, prostate cancer who presents for preop evaluation.  He was planned for radical prostatectomy last week and was found on preop ECG to be in atrial fibrillation.  His surgery was canceled and he was referred to cardiology for further evaluation.  He denies any symptoms from atrial fibrillation.  Specifically denies any palpitations, lightheadedness, chest pain, dyspnea, or syncope.  Reports he used to be very active but activity has decreased due to knee pain.  Was able to walk up a flight of stairs without any chest pain or dyspnea on a vacation in March, has not had to walk up stairs since that time.  His wife reports he snores.  Calcium score on 03/15/2016 was 218 (79th percentile).  Echocardiogram on 03/15/2016 showed normal biventricular function, grade 2 diastolic dysfunction, no significant valvular disease.  ETT on 03/21/2016 showed no evidence of ischemia, hypertensive response to exercise.    Past Medical History:  Diagnosis Date   Achilles tendonitis    Arthritis    lower back   Diabetes ([place])    DVT (deep venous thrombosis) ([place])    RIGHT LEG   H/O hiatal hernia    Hypertension    Lower back pain    Prostate cancer ([place])    Sprain and strain of calcaneofibular ligament     Past Surgical History:  Procedure Laterality Date   COLONOSCOPY N/A 07/14/2013   Procedure: COLONOSCOPY;  Surgeon: Garlan Fair, MD;  Location: [facility];  Service: Endoscopy;  Laterality: N/A;   MANIPULATON OF RIGHT SHOULDER      PROSTATE BIOPSY      Current Medications: Current Meds  Medication Sig   alfuzosin (UROXATRAL) 10 MG 24 hr  tablet Take 10 mg by mouth at bedtime.   allopurinol (ZYLOPRIM) 100 MG tablet Take 1 tablet (100 mg total) by mouth 2 (two) times daily. Annual appt due in May must see provider for future refills (Patient taking differently: Take 100 mg by mouth 2 (two) times daily.)   apixaban (ELIQUIS) 5 MG TABS tablet Take 1 tablet (5 mg total) by mouth 2 (two) times daily.   cetirizine (ZYRTEC) 10 MG tablet Take 10 mg by mouth daily.   Cholecalciferol 25 MCG (1000 UT) tablet Take 1,000 Units by mouth daily.   empagliflozin (JARDIANCE) 25 MG TABS tablet Take 12.5 mg by mouth daily.   ferrous sulfate 325 (65 FE) MG tablet Take 325 mg by mouth daily with breakfast. NOT TAKING   glucose blood test strip Use as instructed. DX E11.65   ibuprofen (ADVIL) 200 MG tablet Take 200-400 mg by mouth every 6 (six) hours as needed for moderate pain.   Lancets (ACCU-CHEK MULTICLIX) lancets Use as instructed. DX: E11.65   lisinopril (ZESTRIL) 40 MG tablet Take 40 mg by mouth daily.   metoprolol succinate (TOPROL-XL) 50 MG 24 hr tablet Take 25 mg by mouth daily. Take with or immediately following a meal total 25mg    naproxen sodium (ALEVE) 220 MG tablet Take 220-440 mg by mouth daily as needed (pain).   Omega-3 Fatty Acids (FISH OIL CONCENTRATE) 1000 MG CAPS Take 2,000 mg  by mouth in the morning and at bedtime.   [DISCONTINUED] aspirin 81 MG chewable tablet Chew 81 mg by mouth daily.     Allergies:   Sulfonamide derivatives   Social History   Socioeconomic History   Marital status: Married    Spouse name: [name]   Number of children: 1   Years of education: Not on file   Highest education level: Not on file  Occupational History   Not on file  Tobacco Use   Smoking status: Former    Packs/day: 1.00    Years: 20.00    Pack years: 20.00    Types: Cigarettes    Quit date: 07/08/1996    Years since quitting: 24.3   Smokeless tobacco: Former    Types: Chew   Tobacco comments:    Quit 1998  Vaping Use   Vaping Use:  Never used  Substance and Sexual Activity   Alcohol use: Yes    Alcohol/week: 0.0 standard drinks   Drug use: No   Sexual activity: Yes  Other Topics Concern   Not on file  Social History Narrative   Not on file   Social Determinants of Health   Financial Resource Strain: Not on file  Food Insecurity: Not on file  Transportation Needs: Not on file  Physical Activity: Not on file  Stress: Not on file  Social Connections: Not on file     Family History: The patient's family history includes Cancer in his brother and paternal aunt; Cancer (age of onset: 56) in his brother; Cancer (age of onset: 63) in his father; Heart attack in his maternal uncle; Heart disease in his mother; Lung cancer in his brother, father, and paternal uncle; Prostate cancer in his brother; Stroke in his maternal grandmother. There is no history of Breast cancer, Colon cancer, or Pancreatic cancer.  ROS:   Please see the history of present illness.    (+) snores (+) palpitations  All other systems reviewed and are negative.  EKGs/Labs/Other Studies Reviewed:    03/21/2016: ETT Study Highlights   Blood pressure demonstrated a hypertensive response to exercise. There was no ST segment deviation noted during stress.   Baseline ECG RBBB Normal ETT Hypertensive response to exercise  03/15/2016: ct cardiac scoring  IMPRESSION: Coronary calcium score of 218. This was 79th percentile for age and sex matched control.  03/15/2016: ECHO Impressions:   - Normal LV size with mild LV hypertrophy. Moderate diastolic    dysfunction. EF 60-65%. Normal RV size and systolic function. No    significant valvular abnormalities.   EKG:  10/24/2020: Atrial fib , HR 60 bpm  Recent Labs: 10/18/2020: BUN 34; Creatinine, Ser 1.12; Hemoglobin 15.2; Platelets 172; Potassium 4.4; Sodium 140  Recent Lipid Panel    Component Value Date/Time   CHOL 197 08/08/2016 1012   TRIG 229.0 (H) 08/08/2016 1012   HDL 61.00  08/08/2016 1012   CHOLHDL 3 08/08/2016 1012   VLDL 45.8 (H) 08/08/2016 1012   LDLDIRECT 96.0 08/08/2016 1012    Physical Exam:    VS:  BP 140/77   Pulse 60   Ht 6\' 1"  (1.854 m)   Wt 296 lb 3.2 oz (134.4 kg)   SpO2 93%   BMI 39.08 kg/m     Wt Readings from Last 3 Encounters:  10/24/20 296 lb 3.2 oz (134.4 kg)  10/18/20 295 lb (133.8 kg)  08/25/20 294 lb (133.4 kg)     GEN:  in no acute distress HEENT: Normal NECK:  No JVD; No carotid bruits LYMPHATICS: No lymphadenopathy CARDIAC: irregular, no murmurs, rubs, gallops RESPIRATORY:  Clear to auscultation without rales, wheezing or rhonchi  ABDOMEN: Soft, non-tender, non-distended MUSCULOSKELETAL:  trace LE edema; No deformity  SKIN: Warm and dry NEUROLOGIC:  Alert and oriented x 3 PSYCHIATRIC:  Normal affect   ASSESSMENT:    1. Persistent atrial fibrillation ([place])   2. Pre-op evaluation   3. Essential hypertension   4. Coronary artery disease involving native coronary artery of native heart without angina pectoris    PLAN:     Atrial fibrillation: Noted on preop EKG on 10/18/2020 prior to prostatectomy.  Surgery was canceled.  CHA2DS2-VASc score 2 (hypertension, diabetes) -Echocardiogram scheduled for today, will follow-up results -Sleep study -Rates are well controlled, continue metoprolol -Recommend starting Eliquis 5 mg twice daily.  He has prostatectomy planned on 7/21, recommend starting Eliquis postoperatively once okay from surgical perspective.  He is asymptomatic in A. fib with rates well controlled, no urgency to cardioversion.  Would recommend follow-up to consider cardioversion once he has completed 3 weeks of uninterrupted anticoagulation  Preop evaluation: Prior to prostatectomy.  Functional capacity has been limited due to knee pain, though does report able to walk up flight of stairs without chest pain or dyspnea.  RCRI score 1, corresponding to 6% 30-day risk of death, MI, or cardiac arrest. -Given new A.  fib, recommend echocardiogram as above (planned for today).  If echocardiogram unremarkable, no further cardiac work-up recommended prior to his surgery.   CAD: Calcium score on 03/15/2016 was 218 (79th percentile).  Echocardiogram on 03/15/2016 showed normal biventricular function, grade 2 diastolic dysfunction, no significant valvular disease.  ETT on 03/21/2016 showed no evidence of ischemia, hypertensive response to exercise. -Continue atorvastatin 40 mg daily -Discontinue aspirin since starting Eliquis as above  Hypertension: On Toprol-XL 25 mg daily and lisinopril 40 mg daily  Hyperlipidemia: On atorvastatin 40 mg daily  T2DM: On metformin, Jardiance   RTC as needed.  He reports that he would like to establish care with cardiologist near his home in [place]   Medication Adjustments/Labs and Tests Ordered: Current medicines are reviewed at length with the patient today.  Concerns regarding medicines are outlined above.  Orders Placed This Encounter  Procedures   EKG 12-Lead   Meds ordered this encounter  Medications   apixaban (ELIQUIS) 5 MG TABS tablet    Sig: Take 1 tablet (5 mg total) by mouth 2 (two) times daily.    Dispense:  60 tablet    Refill:  3    HOLDING UNTIL AFTER SURGERY    Patient Instructions  Medication Instructions:  STOP Aspirin  START Eliquis 5 mg twice daily (you will hold this until after your surgery, and the surgeon gives the okay to start)   *If you need a refill on your cardiac medications before your next appointment, please call your pharmacy*  Follow-Up: At [facility] you and your health needs are our priority.  As part of our continuing mission to provide you with exceptional heart care, we have created designated Provider Care Teams.  These Care Teams include your primary Cardiologist (physician) and Advanced Practice Providers (APPs -  Physician Assistants and Nurse Practitioners) who all work together to provide you with the care you  need, when you need it.  We recommend signing up for the patient portal called "MyChart".  Sign up information is provided on this After Visit Summary.  MyChart is used to connect with patients for  Virtual Visits (Telemedicine).  Patients are able to view lab/test results, encounter notes, upcoming appointments, etc.  Non-urgent messages can be sent to your provider as well.   To learn more about what you can do with MyChart, go to [email or web address].    Your next appointment:   As needed  The format for your next appointment:   In Person  Provider:   Oswaldo Milian, MD   Other Instructions Please make sure to see Cardiologist where you currently live, we also recommend to have a sleep study completed.      I,Jada Bradford,acting as a Education administrator for Donato Heinz, MD.,have documented all relevant documentation on the behalf of Donato Heinz, MD,as directed by  Donato Heinz, MD while in the presence of Donato Heinz, MD.  I, Donato Heinz, MD, have reviewed all documentation for this visit. The documentation on 10/24/20 for the exam, diagnosis, procedures, and orders are all accurate and complete.   Signed, Donato Heinz, MD  10/24/2020 11:23 PM    [place]

## 2020-10-24 ENCOUNTER — Ambulatory Visit (INDEPENDENT_AMBULATORY_CARE_PROVIDER_SITE_OTHER): Admitting: Cardiology

## 2020-10-24 ENCOUNTER — Ambulatory Visit (HOSPITAL_COMMUNITY): Attending: Internal Medicine

## 2020-10-24 ENCOUNTER — Other Ambulatory Visit (HOSPITAL_COMMUNITY): Payer: Self-pay | Admitting: Cardiology

## 2020-10-24 ENCOUNTER — Other Ambulatory Visit: Payer: Self-pay

## 2020-10-24 VITALS — BP 140/77 | HR 60 | Ht 73.0 in | Wt 296.2 lb

## 2020-10-24 DIAGNOSIS — I10 Essential (primary) hypertension: Secondary | ICD-10-CM

## 2020-10-24 DIAGNOSIS — Z01818 Encounter for other preprocedural examination: Secondary | ICD-10-CM | POA: Diagnosis present

## 2020-10-24 DIAGNOSIS — I4819 Other persistent atrial fibrillation: Secondary | ICD-10-CM

## 2020-10-24 DIAGNOSIS — Z01810 Encounter for preprocedural cardiovascular examination: Secondary | ICD-10-CM

## 2020-10-24 DIAGNOSIS — I2510 Atherosclerotic heart disease of native coronary artery without angina pectoris: Secondary | ICD-10-CM

## 2020-10-24 LAB — ECHOCARDIOGRAM COMPLETE
Area-P 1/2: 4.64 cm2
Height: 73 in
S' Lateral: 2.5 cm
Weight: 4739.2 oz

## 2020-10-24 MED ORDER — APIXABAN 5 MG PO TABS: 5.0000 mg | ORAL_TABLET | Freq: Two times a day (BID) | ORAL | 3 refills | Status: DC

## 2020-10-24 NOTE — Patient Instructions (Signed)
Medication Instructions:  STOP Aspirin  START Eliquis 5 mg twice daily (you will hold this until after your surgery, and the surgeon gives the okay to start)   *If you need a refill on your cardiac medications before your next appointment, please call your pharmacy*  Follow-Up: At [facility], you and your health needs are our priority.  As part of our continuing mission to provide you with exceptional heart care, we have created designated Provider Care Teams.  These Care Teams include your primary Cardiologist (physician) and Advanced Practice Providers (APPs -  Physician Assistants and Nurse Practitioners) who all work together to provide you with the care you need, when you need it.  We recommend signing up for the patient portal called "MyChart".  Sign up information is provided on this After Visit Summary.  MyChart is used to connect with patients for Virtual Visits (Telemedicine).  Patients are able to view lab/test results, encounter notes, upcoming appointments, etc.  Non-urgent messages can be sent to your provider as well.   To learn more about what you can do with MyChart, go to [email or web address].    Your next appointment:   As needed  The format for your next appointment:   In Person  Provider:   Oswaldo Milian, MD   Other Instructions Please make sure to see Cardiologist where you currently live, we also recommend to have a sleep study completed.

## 2020-10-24 NOTE — Progress Notes (Signed)
Anesthesia Chart Review   Case: [id number] Date/Time: 10/27/20 1105   Procedures:      XI ROBOTIC ASSISTED LAPAROSCOPIC RADICAL PROSTATECTOMY LEVEL 2     LYMPHADENECTOMY, PELVIC (Bilateral)   Anesthesia type: General   Pre-op diagnosis: PROSTATE CANCER   Location: [facility] [facility] / [facility]   Surgeons: Raynelle Bring, MD       DISCUSSION:59 y.o. former smoker with h/o HTN, DM II, prostate cancer scheduled for above procedure 10/27/2020 with Dr. Raynelle Bring.   Pt with new onset a-fib on EKG at PAT visit.  Will need cardiology evaluation prior to surgery.    VS: BP 127/67   Pulse (!) 58   Temp 36.7 C (Oral)   Resp 16   Ht 6\' 1"  (1.854 m)   Wt 133.8 kg   BMI 38.92 kg/m   PROVIDERS: Pcp, No   LABS: Labs reviewed: Acceptable for surgery. (all labs ordered are listed, but only abnormal results are displayed)  Labs Reviewed  HEMOGLOBIN A1C - Abnormal; Notable for the following components:      Result Value   Hgb A1c MFr Bld 5.8 (*)    All other components within normal limits  CBC - Abnormal; Notable for the following components:   RDW 15.9 (*)    All other components within normal limits  BASIC METABOLIC PANEL - Abnormal; Notable for the following components:   Glucose, Bld 116 (*)    BUN 34 (*)    All other components within normal limits  GLUCOSE, CAPILLARY - Abnormal; Notable for the following components:   Glucose-Capillary 113 (*)    All other components within normal limits  TYPE AND SCREEN     IMAGES:   EKG: 10/18/2020 Rate 63 bpm  Atrial fibrillation  RBBB  CV: Echo 10/24/2020 1. Left ventricular ejection fraction, by estimation, is 60 to 65%. The  left ventricle has normal function. The left ventricle has no regional  wall motion abnormalities. There is mild concentric left ventricular  hypertrophy. Left ventricular diastolic  parameters are indeterminate.   2. Right ventricular systolic function is normal. The right ventricular  size is moderately  enlarged.   3. Left atrial size was mildly dilated.   4. Right atrial size was mild to moderately dilated.   5. The mitral valve is normal in structure. Mild mitral valve  regurgitation. No evidence of mitral stenosis.   6. The aortic valve is tricuspid. There is mild calcification of the  aortic valve. There is mild thickening of the aortic valve. Aortic valve  regurgitation is not visualized. Mild aortic valve sclerosis is present,  with no evidence of aortic valve  stenosis.   7. Aortic dilatation noted. There is mild dilatation of the aortic root,  measuring 40 mm.   8. The inferior vena cava is normal in size with greater than 50%  respiratory variability, suggesting right atrial pressure of 3 mmHg.   Exercise Tolerance Test 03/21/2016  Blood pressure demonstrated a hypertensive response to exercise. There was no ST segment deviation noted during stress.   Baseline ECG RBBB Normal ETT Hypertensive response to exercise Past Medical History:  Diagnosis Date   Achilles tendonitis    Arthritis    lower back   Diabetes ([place])    DVT (deep venous thrombosis) ([facility])    RIGHT LEG   H/O hiatal hernia    Hypertension    Lower back pain    Prostate cancer ([place])    Sprain and strain of calcaneofibular ligament  Past Surgical History:  Procedure Laterality Date   COLONOSCOPY N/A 07/14/2013   Procedure: COLONOSCOPY;  Surgeon: Garlan Fair, MD;  Location: [facility];  Service: Endoscopy;  Laterality: N/A;   MANIPULATON OF RIGHT SHOULDER      PROSTATE BIOPSY      MEDICATIONS:  alfuzosin (UROXATRAL) 10 MG 24 hr tablet   allopurinol (ZYLOPRIM) 100 MG tablet   apixaban (ELIQUIS) 5 MG TABS tablet   atorvastatin (LIPITOR) 40 MG tablet   cetirizine (ZYRTEC) 10 MG tablet   Cholecalciferol 25 MCG (1000 UT) tablet   empagliflozin (JARDIANCE) 25 MG TABS tablet   ferrous sulfate 325 (65 FE) MG tablet   glucose blood test strip   ibuprofen (ADVIL) 200 MG tablet   Lancets  (ACCU-CHEK MULTICLIX) lancets   lisinopril (ZESTRIL) 40 MG tablet   metFORMIN (GLUCOPHAGE) 500 MG tablet   metoprolol succinate (TOPROL-XL) 50 MG 24 hr tablet   naproxen sodium (ALEVE) 220 MG tablet   Omega-3 Fatty Acids (FISH OIL CONCENTRATE) 1000 MG CAPS   No current facility-administered medications for this encounter.

## 2020-10-25 ENCOUNTER — Encounter (HOSPITAL_COMMUNITY): Payer: Self-pay | Admitting: Urology

## 2020-10-25 ENCOUNTER — Other Ambulatory Visit (HOSPITAL_COMMUNITY)
Admission: RE | Admit: 2020-10-25 | Discharge: 2020-10-25 | Disposition: A | Discharge status: Home / Self Care | Source: Ambulatory Visit | Attending: Urology | Admitting: Urology

## 2020-10-25 DIAGNOSIS — Z20822 Contact with and (suspected) exposure to covid-19: Secondary | ICD-10-CM | POA: Insufficient documentation

## 2020-10-25 DIAGNOSIS — Z01812 Encounter for preprocedural laboratory examination: Secondary | ICD-10-CM | POA: Diagnosis not present

## 2020-10-25 LAB — SARS CORONAVIRUS 2 (TAT 6-24 HRS): SARS Coronavirus 2: NEGATIVE

## 2020-10-25 NOTE — Progress Notes (Signed)
[name] made aware to arrive at 9:20AM 10/27/2020, updated medical history, reviewed pre op instructions. Mr and [name]'s questions were answered and they verbalized understanding.

## 2020-10-26 NOTE — Anesthesia Preprocedure Evaluation (Addendum)
Anesthesia Evaluation  Patient identified by MRN, date of birth, ID band Patient awake    Reviewed: Allergy & Precautions, NPO status , Patient's Chart, lab work & pertinent test results  Airway Mallampati: III  TM Distance: >3 FB Neck ROM: Full    Dental no notable dental hx. (+) Teeth Intact, Dental Advisory Given   Pulmonary former smoker,    Pulmonary exam normal breath sounds clear to auscultation       Cardiovascular hypertension, Pt. on medications and Pt. on home beta blockers + DVT  + dysrhythmias (New onset Afib) Atrial Fibrillation  Rhythm:Irregular Rate:Normal  10/24/20 TTE 1. Left ventricular ejection fraction, by estimation, is 60 to 65%. The  left ventricle has normal function. The left ventricle has no regional  wall motion abnormalities. There is mild concentric left ventricular  hypertrophy. Left ventricular diastolic  parameters are indeterminate.  2. Right ventricular systolic function is normal. The right ventricular  size is moderately enlarged.  3. Left atrial size was mildly dilated.  4. Right atrial size was mild to moderately dilated.  5. The mitral valve is normal in structure. Mild mitral valve  regurgitation. No evidence of mitral stenosis.  6. The aortic valve is tricuspid. There is mild calcification of the  aortic valve. There is mild thickening of the aortic valve. Aortic valve  regurgitation is not visualized. Mild aortic valve sclerosis is present,  with no evidence of aortic valve  stenosis.  7. Aortic dilatation noted. There is mild dilatation of the aortic root,  measuring 40 mm.  8. The inferior vena cava is normal in size with greater than 50%  respiratory variability, suggesting right atrial pressure of 3 mmHg.    Neuro/Psych negative psych ROS   GI/Hepatic Neg liver ROS, hiatal hernia,   Endo/Other  diabetes, Type 2Morbid obesity (BmI 39.09)  Renal/GU Lab Results       Component                Value               Date                      CREATININE               1.12                10/18/2020                BUN                      34 (H)              10/18/2020                NA                       140                 10/18/2020                K                        4.4                 10/18/2020                CL  109                 10/18/2020                CO2                      24                  10/18/2020              Prostate CA    Musculoskeletal  (+) Arthritis  (Gout),   Abdominal (+) + obese (BMI 39),   Peds  Hematology Lab Results      Component                Value               Date                      WBC                      5.5                 10/18/2020                HGB                      15.2                10/18/2020                HCT                      45.6                10/18/2020                MCV                      92.5                10/18/2020                PLT                      172                 10/18/2020             Tx S sent   Anesthesia Other Findings All: Sulfa  Reproductive/Obstetrics                           Anesthesia Physical Anesthesia Plan  ASA: 3  Anesthesia Plan: General   Post-op Pain Management:    Induction: Intravenous  PONV Risk Score and Plan: 3 and Treatment may vary due to age or medical condition, Midazolam, Ondansetron and Dexamethasone  Airway Management Planned: Oral ETT  Additional Equipment: Arterial line  Intra-op Plan:   Post-operative Plan: Extubation in OR  Informed Consent: I have reviewed the patients History and Physical, chart, labs and discussed the procedure including the risks, benefits and alternatives for the proposed anesthesia with the patient or authorized representative who has indicated his/her understanding and acceptance.     Dental advisory given  Plan Discussed with: CRNA and  Anesthesiologist  Anesthesia Plan Comments: (  GA  w [place] 2 Lage IVs + lidocaine infusion +/- Ketamine)       Anesthesia Quick Evaluation

## 2020-10-26 NOTE — H&P (Signed)
Office Visit Report     10/13/2020   --------------------------------------------------------------------------------   [name]  MRN: [id number]  DOB: Feb 18, 1961, 60 year old Male  SSN:    PRIMARY CARE:    REFERRING:    PROVIDER:  Raynelle Baker, M.D.  LOCATION:  [facility] [phone]     --------------------------------------------------------------------------------   CC/HPI: CC: Prostate Cancer    [name] is a 60 year old gentleman who was noted to have an elevated PSA of 5.9 prompting a TRUS biopsy of the prostate on 02/11/20 at the [place] [place] [facility] by Dr. Lexine Baker. This demonstrated Gleason 3+4=7 adenocarcinoma of the prostate with 3 out of 12 biopsy cores positive for malignancy. He has been counseled regarding options for management/treatment and has had a difficult time making a final decision about the approach he wishes to take. His wife is [name], who previously worked with [employer]. After his initial consultation in April 2022 and confirming his pathology results on review here, he proceeded with a radiation oncology consultation with Dr. Tammi Baker. He was leaning toward proceeding with brachytherapy but after discussion and further considering his baseline lower urinary tract symptoms, he elected surgical treatment. He presents today for further discussion in anticipation of proceeding with surgery next week.   Family history: He has a brother with prostate cancer underwent definitive treatment with brachytherapy.   Imaging studies: None.   PMH: He has a history of obesity (301 lbs), diabetes, hypertension, hyperlipidemia, DVT, and gout. His DVT occurred around 2015 was related to driving back and forth this out [place]. He was treated with anticoagulation. He did undergo a hematologic evaluation without any risk factors noted. He also had a superficial vein thrombosis approximately 1 year ago treated with anticoagulation.   PSH: No abdominal surgeries   TNM stage: cT1c Nx Mx  PSA: 5.9  Gleason score: 3+4=7 (GG 2)  Biopsy (02/11/20 - read by Dr. Warrick Baker, [facility]): 3/12 cores positive  Left: Benign  Right: R mid (2/2 cores, 3+4=7), R base (1/2 cores, 3+3=6)  Prostate volume: 54.9 cc   Nomogram  OC disease: 70%  EPE: 28%  SVI: 2%  LNI: 3%  PFS (5 year, 10 year): 87%, 78%   Urinary function: IPSS is 18. This is despite alpha-blocker therapy with alfuzosin which has tremendously improved his urinary situation. He now finds his urinary symptoms to be manageable although still a nuisance.  Erectile function: SHIM score is 24.     ALLERGIES: SULFA    MEDICATIONS: Allopurinol 100 mg tablet  Aspirin 81 mg tablet,chewable  Lisinopril 40 mg tablet  Metformin Hcl 500 mg tablet  Metoprolol Succinate 25 mg tablet, extended release 24 hr  Alfuzosin Hcl Er 10 mg tablet, extended release 24 hr  Atorvastatin Calcium 40 mg tablet  Cetirizine Hcl 10 mg tablet  Jardiance 25 mg tablet     GU PSH: No GU PSH    NON-GU PSH: No Non-GU PSH    GU PMH: Prostate Cancer - 08/02/2020    NON-GU PMH: Arthritis Diabetes Type 2 Gout Hypertension    FAMILY HISTORY: Lung Cancer - Father, Brother Prostate Cancer - Brother   SOCIAL HISTORY: Marital Status: Married Ethnicity: Not Hispanic Or Latino; Race: White Current Smoking Status: Patient does not smoke anymore. Has not smoked since 07/08/1996. Smoked for 20 years. Smoked 1 pack per day.   Tobacco Use Assessment Completed: Used Tobacco in last 30 days? Does drink.  Drinks 1  caffeinated drink per day.    REVIEW OF SYSTEMS:    GU Review Male:   Patient denies frequent urination, hard to postpone urination, burning/ pain with urination, get up at night to urinate, leakage of urine, stream starts and stops, trouble starting your streams, and have to strain to urinate .  Gastrointestinal (Upper):   Patient denies nausea and vomiting.   Gastrointestinal (Lower):   Patient denies diarrhea and constipation.  Constitutional:   Patient denies fever, night sweats, weight loss, and fatigue.  Skin:   Patient denies skin rash/ lesion and itching.  Eyes:   Patient denies blurred vision and double vision.  Ears/ Nose/ Throat:   Patient denies sore throat and sinus problems.  Hematologic/Lymphatic:   Patient denies swollen glands and easy bruising.  Cardiovascular:   Patient denies leg swelling and chest pains.  Respiratory:   Patient denies cough and shortness of breath.  Endocrine:   Patient denies excessive thirst.  Musculoskeletal:   Patient denies back pain and joint pain.  Neurological:   Patient denies headaches and dizziness.  Psychologic:   Patient denies anxiety and depression.   VITAL SIGNS:      10/13/2020 03:35 PM  Weight 290 lb / 131.54 kg  Height 7 in / 17.78 cm  BP 149/85 mmHg  Pulse 59 /min  Temperature 96.9 F / 36.0 C  BMI 4,160.6 kg/m   GU PHYSICAL EXAMINATION:    Prostate: Prostate about 50 grams. Left lobe normal consistency, right lobe normal consistency. Symmetrical lobes. No prostate nodule. Left lobe no tenderness, right lobe no tenderness.    MULTI-SYSTEM PHYSICAL EXAMINATION:    Constitutional: Well-nourished. No physical deformities. Normally developed. Good grooming.  Respiratory: No labored breathing, no use of accessory muscles. Clear bilaterally.  Cardiovascular: Normal temperature, normal extremity pulses, no swelling, no varicosities. Regular rate and rhythm.  Gastrointestinal: No mass, no tenderness, no rigidity, obese abdomen.      Complexity of Data:  Lab Test Review:   PSA  Records Review:   Pathology Reports, Previous Patient Records   PROCEDURES:          Urinalysis Dipstick Dipstick Cont'd  Color: Yellow Bilirubin: Neg mg/dL  Appearance: Clear Ketones: 1+ mg/dL  Specific Gravity: 1.025 Blood: Neg ery/uL  pH: 6.0 Protein: Trace mg/dL  Glucose: 2+ mg/dL Urobilinogen: 0.2  mg/dL    Nitrites: Neg    Leukocyte Esterase: Neg leu/uL    ASSESSMENT:      ICD-10 Details  1 GU:   Prostate Cancer - C61    PLAN:           Schedule Return Visit/Planned Activity: Keep Scheduled Appointment          Document Letter(s):  Created for Patient: Clinical Summary         Notes:   1. Favorable intermediate risk prostate cancer: He has confirmed his decision to proceed with surgical treatment for curative therapy. In large part, this is related to his baseline lower urinary tract symptoms. He is scheduled for a bilateral nerve-sparing robot assisted laparoscopic radical prostatectomy and bilateral pelvic lymphadenectomy.   We discussed surgical therapy for prostate cancer including the different available surgical approaches. We discussed, in detail, the risks and expectations of surgery with regard to cancer control, urinary control, and erectile function as well as the expected postoperative recovery process. Additional risks of surgery including but not limited to bleeding, infection, hernia formation, nerve damage, lymphocele formation, bowel/rectal injury potentially necessitating colostomy, damage to the urinary tract  resulting in urine leakage, urethral stricture, and the cardiopulmonary risks such as myocardial infarction, stroke, death, venothromboembolism, etc. were explained. The risk of open surgical conversion for robotic/laparoscopic prostatectomy was also discussed.   Considering his history of prior DVT, he will be treated with subcutaneous heparin perioperatively. In addition, due to his severe knee pain related to meniscal tears in his left knee, we will discuss placing him on Celebrex postoperatively upon discharge from the hospital. All questions were answered to his stated satisfaction.   Cc: Dr. Ouida Sills  Dr. Tyler Pita  Dr. Lexine Baker           Next Appointment:      Next Appointment: 10/14/2020 11:00 AM    Appointment Type: 60 Physical  Therapy    Location: [facility] [phone]    Provider: Mertha Finders, PT    Reason for Visit: initial eval pre-op      E & M CODES: We spent 42 minutes dedicated to evaluation and management time, including face to face interaction, discussions on coordination of care, documentation, result review, and discussion with others as applicable.     * Signed by [name], M.D. on 10/13/20 at 5:28 PM (EDT)*

## 2020-10-27 ENCOUNTER — Encounter (HOSPITAL_COMMUNITY)
Admission: RE | Disposition: A | Discharge status: Home / Self Care | Payer: Self-pay | Source: Ambulatory Visit | Attending: Urology

## 2020-10-27 ENCOUNTER — Encounter (HOSPITAL_COMMUNITY): Payer: Self-pay | Admitting: Urology

## 2020-10-27 ENCOUNTER — Ambulatory Visit (HOSPITAL_COMMUNITY): Admitting: Anesthesiology

## 2020-10-27 ENCOUNTER — Observation Stay (HOSPITAL_COMMUNITY)
Admission: RE | Admit: 2020-10-27 | Discharge: 2020-10-29 | Disposition: A | Discharge status: Home / Self Care | Source: Ambulatory Visit | Attending: Urology | Admitting: Urology

## 2020-10-27 ENCOUNTER — Ambulatory Visit (HOSPITAL_COMMUNITY): Admitting: Physician Assistant

## 2020-10-27 DIAGNOSIS — I10 Essential (primary) hypertension: Secondary | ICD-10-CM | POA: Insufficient documentation

## 2020-10-27 DIAGNOSIS — Z8042 Family history of malignant neoplasm of prostate: Secondary | ICD-10-CM | POA: Insufficient documentation

## 2020-10-27 DIAGNOSIS — E119 Type 2 diabetes mellitus without complications: Secondary | ICD-10-CM | POA: Insufficient documentation

## 2020-10-27 DIAGNOSIS — Z7984 Long term (current) use of oral hypoglycemic drugs: Secondary | ICD-10-CM | POA: Diagnosis not present

## 2020-10-27 DIAGNOSIS — Z79899 Other long term (current) drug therapy: Secondary | ICD-10-CM | POA: Diagnosis not present

## 2020-10-27 DIAGNOSIS — C61 Malignant neoplasm of prostate: Principal | ICD-10-CM | POA: Diagnosis present

## 2020-10-27 DIAGNOSIS — Z7982 Long term (current) use of aspirin: Secondary | ICD-10-CM | POA: Diagnosis not present

## 2020-10-27 HISTORY — PX: ROBOT ASSISTED LAPAROSCOPIC RADICAL PROSTATECTOMY: SHX5141

## 2020-10-27 HISTORY — PX: LYMPHADENECTOMY: SHX5960

## 2020-10-27 LAB — GLUCOSE, CAPILLARY
Glucose-Capillary: 123 mg/dL — ABNORMAL HIGH (ref 70–99)
Glucose-Capillary: 126 mg/dL — ABNORMAL HIGH (ref 70–99)
Glucose-Capillary: 134 mg/dL — ABNORMAL HIGH (ref 70–99)
Glucose-Capillary: 137 mg/dL — ABNORMAL HIGH (ref 70–99)
Glucose-Capillary: 140 mg/dL — ABNORMAL HIGH (ref 70–99)

## 2020-10-27 LAB — HEMOGLOBIN AND HEMATOCRIT, BLOOD
HCT: 47.9 % (ref 39.0–52.0)
Hemoglobin: 15.3 g/dL (ref 13.0–17.0)

## 2020-10-27 LAB — TYPE AND SCREEN
ABO/RH(D): A POS
Antibody Screen: NEGATIVE

## 2020-10-27 SURGERY — XI ROBOTIC ASSISTED LAPAROSCOPIC RADICAL PROSTATECTOMY LEVEL 2
Anesthesia: General

## 2020-10-27 MED ORDER — CHLORHEXIDINE GLUCONATE CLOTH 2 % EX PADS
6.0000 | MEDICATED_PAD | Freq: Every day | CUTANEOUS | Status: DC
  Administered 2020-10-28 – 2020-10-29 (×2): 6 via TOPICAL

## 2020-10-27 MED ORDER — CHLORHEXIDINE GLUCONATE 0.12 % MT SOLN
15.0000 mL | Freq: Once | OROMUCOSAL | Status: AC
  Administered 2020-10-27: 15 mL via OROMUCOSAL

## 2020-10-27 MED ORDER — LIDOCAINE 20MG/ML (2%) 15 ML SYRINGE OPTIME
INTRAMUSCULAR | Status: DC | PRN
  Administered 2020-10-27: 1 mg/kg/h via INTRAVENOUS

## 2020-10-27 MED ORDER — PROPOFOL 10 MG/ML IV BOLUS
INTRAVENOUS | Status: AC
  Filled 2020-10-27: qty 20

## 2020-10-27 MED ORDER — ALBUMIN HUMAN 5 % IV SOLN
INTRAVENOUS | Status: AC
  Filled 2020-10-27: qty 500

## 2020-10-27 MED ORDER — ZOLPIDEM TARTRATE 5 MG PO TABS
5.0000 mg | ORAL_TABLET | Freq: Every evening | ORAL | Status: DC | PRN
  Administered 2020-10-28: 5 mg via ORAL
  Filled 2020-10-27: qty 1

## 2020-10-27 MED ORDER — MORPHINE SULFATE (PF) 2 MG/ML IV SOLN
2.0000 mg | INTRAVENOUS | Status: DC | PRN
  Administered 2020-10-27: 2 mg via INTRAVENOUS
  Administered 2020-10-28: 4 mg via INTRAVENOUS
  Filled 2020-10-27: qty 2
  Filled 2020-10-27: qty 1

## 2020-10-27 MED ORDER — ATORVASTATIN CALCIUM 40 MG PO TABS
40.0000 mg | ORAL_TABLET | Freq: Every day | ORAL | Status: DC
  Administered 2020-10-27 – 2020-10-29 (×3): 40 mg via ORAL
  Filled 2020-10-27 (×3): qty 1

## 2020-10-27 MED ORDER — BELLADONNA ALKALOIDS-OPIUM 16.2-60 MG RE SUPP
1.0000 | Freq: Four times a day (QID) | RECTAL | Status: DC | PRN
  Administered 2020-10-28: 1 via RECTAL
  Filled 2020-10-27: qty 1

## 2020-10-27 MED ORDER — INSULIN ASPART 100 UNIT/ML IJ SOLN
0.0000 [IU] | INTRAMUSCULAR | Status: DC
  Administered 2020-10-27 – 2020-10-29 (×8): 2 [IU] via SUBCUTANEOUS

## 2020-10-27 MED ORDER — ROCURONIUM BROMIDE 10 MG/ML (PF) SYRINGE
PREFILLED_SYRINGE | INTRAVENOUS | Status: DC | PRN
  Administered 2020-10-27: 100 mg via INTRAVENOUS
  Administered 2020-10-27: 20 mg via INTRAVENOUS

## 2020-10-27 MED ORDER — CIPROFLOXACIN HCL 500 MG PO TABS: 500.0000 mg | ORAL_TABLET | Freq: Two times a day (BID) | ORAL | 0 refills | Status: AC

## 2020-10-27 MED ORDER — HYDROMORPHONE HCL 1 MG/ML IJ SOLN
INTRAMUSCULAR | Status: DC | PRN
  Administered 2020-10-27 (×2): 1 mg via INTRAVENOUS

## 2020-10-27 MED ORDER — GLYCOPYRROLATE PF 0.2 MG/ML IJ SOSY
PREFILLED_SYRINGE | INTRAMUSCULAR | Status: DC | PRN
  Administered 2020-10-27: .2 mg via INTRAVENOUS

## 2020-10-27 MED ORDER — ROCURONIUM BROMIDE 10 MG/ML (PF) SYRINGE
PREFILLED_SYRINGE | INTRAVENOUS | Status: AC
  Filled 2020-10-27: qty 20

## 2020-10-27 MED ORDER — PHENYLEPHRINE 40 MCG/ML (10ML) SYRINGE FOR IV PUSH (FOR BLOOD PRESSURE SUPPORT)
PREFILLED_SYRINGE | INTRAVENOUS | Status: AC
  Filled 2020-10-27: qty 10

## 2020-10-27 MED ORDER — HEPARIN SODIUM (PORCINE) 5000 UNIT/ML IJ SOLN
5000.0000 [IU] | Freq: Three times a day (TID) | INTRAMUSCULAR | Status: DC
  Administered 2020-10-28 – 2020-10-29 (×4): 5000 [IU] via SUBCUTANEOUS
  Filled 2020-10-27 (×4): qty 1

## 2020-10-27 MED ORDER — DIPHENHYDRAMINE HCL 12.5 MG/5ML PO ELIX: 12.5000 mg | ORAL_SOLUTION | Freq: Four times a day (QID) | ORAL | Status: DC | PRN

## 2020-10-27 MED ORDER — FENTANYL CITRATE (PF) 250 MCG/5ML IJ SOLN
INTRAMUSCULAR | Status: AC
  Filled 2020-10-27: qty 5

## 2020-10-27 MED ORDER — EPHEDRINE 5 MG/ML INJ
INTRAVENOUS | Status: AC
  Filled 2020-10-27: qty 5

## 2020-10-27 MED ORDER — HEPARIN SODIUM (PORCINE) 1000 UNIT/ML IJ SOLN
INTRAMUSCULAR | Status: AC
  Filled 2020-10-27: qty 1

## 2020-10-27 MED ORDER — LORATADINE 10 MG PO TABS
10.0000 mg | ORAL_TABLET | Freq: Every day | ORAL | Status: DC
  Administered 2020-10-28 – 2020-10-29 (×2): 10 mg via ORAL
  Filled 2020-10-27 (×2): qty 1

## 2020-10-27 MED ORDER — DEXAMETHASONE SODIUM PHOSPHATE 4 MG/ML IJ SOLN
INTRAMUSCULAR | Status: DC | PRN
  Administered 2020-10-27: 5 mg via INTRAVENOUS

## 2020-10-27 MED ORDER — LACTATED RINGERS IV SOLN: INTRAVENOUS | Status: DC

## 2020-10-27 MED ORDER — MIDAZOLAM HCL 2 MG/2ML IJ SOLN
INTRAMUSCULAR | Status: AC
  Filled 2020-10-27: qty 2

## 2020-10-27 MED ORDER — SUGAMMADEX SODIUM 200 MG/2ML IV SOLN
INTRAVENOUS | Status: DC | PRN
  Administered 2020-10-27: 300 mg via INTRAVENOUS

## 2020-10-27 MED ORDER — CEFAZOLIN SODIUM-DEXTROSE 1-4 GM/50ML-% IV SOLN
1.0000 g | Freq: Three times a day (TID) | INTRAVENOUS | Status: AC
  Administered 2020-10-27 – 2020-10-28 (×2): 1 g via INTRAVENOUS
  Filled 2020-10-27 (×2): qty 50

## 2020-10-27 MED ORDER — KETOROLAC TROMETHAMINE 15 MG/ML IJ SOLN
15.0000 mg | Freq: Four times a day (QID) | INTRAMUSCULAR | Status: AC
  Administered 2020-10-27 – 2020-10-28 (×6): 15 mg via INTRAVENOUS
  Filled 2020-10-27 (×6): qty 1

## 2020-10-27 MED ORDER — LACTATED RINGERS IV SOLN: INTRAVENOUS | Status: DC | PRN

## 2020-10-27 MED ORDER — STERILE WATER FOR IRRIGATION IR SOLN
Status: DC | PRN
  Administered 2020-10-27: 1000 mL

## 2020-10-27 MED ORDER — OXYCODONE HCL 5 MG/5ML PO SOLN: 5.0000 mg | Freq: Once | ORAL | Status: DC | PRN

## 2020-10-27 MED ORDER — CEFAZOLIN IN SODIUM CHLORIDE 3-0.9 GM/100ML-% IV SOLN
3.0000 g | Freq: Once | INTRAVENOUS | Status: AC
  Administered 2020-10-27: 3 g via INTRAVENOUS
  Filled 2020-10-27: qty 100

## 2020-10-27 MED ORDER — BUPIVACAINE-EPINEPHRINE 0.25% -1:200000 IJ SOLN
INTRAMUSCULAR | Status: DC | PRN
  Administered 2020-10-27: 40 mL

## 2020-10-27 MED ORDER — EPHEDRINE SULFATE-NACL 50-0.9 MG/10ML-% IV SOSY
PREFILLED_SYRINGE | INTRAVENOUS | Status: DC | PRN
  Administered 2020-10-27: 10 mg via INTRAVENOUS
  Administered 2020-10-27 (×2): 5 mg via INTRAVENOUS

## 2020-10-27 MED ORDER — FERROUS SULFATE 325 (65 FE) MG PO TABS
325.0000 mg | ORAL_TABLET | Freq: Every day | ORAL | Status: DC
  Administered 2020-10-29: 325 mg via ORAL
  Filled 2020-10-27 (×2): qty 1

## 2020-10-27 MED ORDER — KETAMINE HCL 10 MG/ML IJ SOLN
INTRAMUSCULAR | Status: DC | PRN
  Administered 2020-10-27: 30 mg via INTRAVENOUS

## 2020-10-27 MED ORDER — PHENYLEPHRINE HCL (PRESSORS) 10 MG/ML IV SOLN
INTRAVENOUS | Status: AC
  Filled 2020-10-27: qty 1

## 2020-10-27 MED ORDER — HYDROMORPHONE HCL 1 MG/ML IJ SOLN: 0.2500 mg | INTRAMUSCULAR | Status: DC | PRN

## 2020-10-27 MED ORDER — METOPROLOL SUCCINATE ER 25 MG PO TB24
25.0000 mg | ORAL_TABLET | Freq: Every evening | ORAL | Status: DC
  Administered 2020-10-27 – 2020-10-28 (×2): 25 mg via ORAL
  Filled 2020-10-27 (×2): qty 1

## 2020-10-27 MED ORDER — ONDANSETRON HCL 4 MG/2ML IJ SOLN: 4.0000 mg | INTRAMUSCULAR | Status: DC | PRN

## 2020-10-27 MED ORDER — DOCUSATE SODIUM 100 MG PO CAPS
100.0000 mg | ORAL_CAPSULE | Freq: Two times a day (BID) | ORAL | Status: DC
  Administered 2020-10-27 – 2020-10-29 (×4): 100 mg via ORAL
  Filled 2020-10-27 (×4): qty 1

## 2020-10-27 MED ORDER — PHENYLEPHRINE 40 MCG/ML (10ML) SYRINGE FOR IV PUSH (FOR BLOOD PRESSURE SUPPORT)
PREFILLED_SYRINGE | INTRAVENOUS | Status: DC | PRN
  Administered 2020-10-27 (×2): 120 ug via INTRAVENOUS
  Administered 2020-10-27: 80 ug via INTRAVENOUS

## 2020-10-27 MED ORDER — MAGNESIUM CITRATE PO SOLN: 1.0000 | Freq: Once | ORAL | Status: DC

## 2020-10-27 MED ORDER — SODIUM CHLORIDE 0.9 % IV BOLUS
1000.0000 mL | Freq: Once | INTRAVENOUS | Status: AC
  Administered 2020-10-27: 1000 mL via INTRAVENOUS

## 2020-10-27 MED ORDER — ONDANSETRON HCL 4 MG/2ML IJ SOLN
INTRAMUSCULAR | Status: DC | PRN
  Administered 2020-10-27: 4 mg via INTRAVENOUS

## 2020-10-27 MED ORDER — ACETAMINOPHEN 325 MG PO TABS
650.0000 mg | ORAL_TABLET | ORAL | Status: DC | PRN
Start: 2020-10-27 — End: 2020-10-29
  Administered 2020-10-29 (×2): 650 mg via ORAL
  Filled 2020-10-27 (×2): qty 2

## 2020-10-27 MED ORDER — PHENYLEPHRINE HCL-NACL 10-0.9 MG/250ML-% IV SOLN
INTRAVENOUS | Status: DC | PRN
  Administered 2020-10-27: 40 ug/min via INTRAVENOUS

## 2020-10-27 MED ORDER — OXYCODONE HCL 5 MG PO TABS: 5.0000 mg | ORAL_TABLET | Freq: Once | ORAL | Status: DC | PRN

## 2020-10-27 MED ORDER — DEXAMETHASONE SODIUM PHOSPHATE 10 MG/ML IJ SOLN
INTRAMUSCULAR | Status: AC
  Filled 2020-10-27: qty 1

## 2020-10-27 MED ORDER — ORAL CARE MOUTH RINSE: 15.0000 mL | Freq: Once | OROMUCOSAL | Status: AC

## 2020-10-27 MED ORDER — FENTANYL CITRATE (PF) 100 MCG/2ML IJ SOLN
INTRAMUSCULAR | Status: DC | PRN
  Administered 2020-10-27 (×3): 50 ug via INTRAVENOUS
  Administered 2020-10-27: 100 ug via INTRAVENOUS

## 2020-10-27 MED ORDER — HYDROMORPHONE HCL 2 MG/ML IJ SOLN
INTRAMUSCULAR | Status: AC
  Filled 2020-10-27: qty 1

## 2020-10-27 MED ORDER — DIPHENHYDRAMINE HCL 50 MG/ML IJ SOLN: 12.5000 mg | Freq: Four times a day (QID) | INTRAMUSCULAR | Status: DC | PRN

## 2020-10-27 MED ORDER — POTASSIUM CHLORIDE IN NACL 20-0.45 MEQ/L-% IV SOLN
INTRAVENOUS | Status: DC
  Filled 2020-10-27 (×3): qty 1000

## 2020-10-27 MED ORDER — PROPOFOL 10 MG/ML IV BOLUS
INTRAVENOUS | Status: DC | PRN
  Administered 2020-10-27: 200 mg via INTRAVENOUS

## 2020-10-27 MED ORDER — DOCUSATE SODIUM 100 MG PO CAPS: 100.0000 mg | ORAL_CAPSULE | Freq: Two times a day (BID) | ORAL | Status: AC

## 2020-10-27 MED ORDER — MIDAZOLAM HCL 5 MG/5ML IJ SOLN
INTRAMUSCULAR | Status: DC | PRN
  Administered 2020-10-27: 2 mg via INTRAVENOUS

## 2020-10-27 MED ORDER — TRAMADOL HCL 50 MG PO TABS: 50.0000 mg | ORAL_TABLET | Freq: Four times a day (QID) | ORAL | 0 refills | Status: AC | PRN

## 2020-10-27 MED ORDER — SODIUM CHLORIDE 0.9 % IR SOLN
Status: DC | PRN
  Administered 2020-10-27: 1000 mL via INTRAVESICAL

## 2020-10-27 MED ORDER — VITAMIN D3 25 MCG (1000 UNIT) PO TABS
1000.0000 [IU] | ORAL_TABLET | Freq: Every day | ORAL | Status: DC
  Administered 2020-10-27 – 2020-10-29 (×2): 1000 [IU] via ORAL
  Filled 2020-10-27 (×3): qty 1

## 2020-10-27 MED ORDER — ALLOPURINOL 100 MG PO TABS
100.0000 mg | ORAL_TABLET | Freq: Two times a day (BID) | ORAL | Status: DC
  Administered 2020-10-27 – 2020-10-29 (×4): 100 mg via ORAL
  Filled 2020-10-27 (×4): qty 1

## 2020-10-27 MED ORDER — FLEET ENEMA 7-19 GM/118ML RE ENEM: 1.0000 | ENEMA | Freq: Once | RECTAL | Status: DC

## 2020-10-27 MED ORDER — ACETAMINOPHEN 10 MG/ML IV SOLN: 1000.0000 mg | Freq: Once | INTRAVENOUS | Status: DC | PRN

## 2020-10-27 MED ORDER — AMISULPRIDE (ANTIEMETIC) 5 MG/2ML IV SOLN: 10.0000 mg | Freq: Once | INTRAVENOUS | Status: DC | PRN

## 2020-10-27 MED ORDER — ONDANSETRON HCL 4 MG/2ML IJ SOLN
INTRAMUSCULAR | Status: AC
  Filled 2020-10-27: qty 2

## 2020-10-27 MED ORDER — BUPIVACAINE-EPINEPHRINE 0.25% -1:200000 IJ SOLN
INTRAMUSCULAR | Status: AC
  Filled 2020-10-27: qty 1

## 2020-10-27 MED ORDER — LISINOPRIL 20 MG PO TABS
40.0000 mg | ORAL_TABLET | Freq: Every evening | ORAL | Status: DC
  Administered 2020-10-27 – 2020-10-28 (×2): 40 mg via ORAL
  Filled 2020-10-27 (×2): qty 2

## 2020-10-27 MED ORDER — ONDANSETRON HCL 4 MG/2ML IJ SOLN: 4.0000 mg | Freq: Once | INTRAMUSCULAR | Status: DC | PRN

## 2020-10-27 MED ORDER — ALBUMIN HUMAN 5 % IV SOLN: INTRAVENOUS | Status: DC | PRN

## 2020-10-27 MED ORDER — BACITRACIN-NEOMYCIN-POLYMYXIN 400-5-5000 EX OINT
1.0000 "application " | TOPICAL_OINTMENT | Freq: Three times a day (TID) | CUTANEOUS | Status: DC | PRN
  Administered 2020-10-28: 1 via TOPICAL

## 2020-10-27 MED ORDER — LIDOCAINE 2% (20 MG/ML) 5 ML SYRINGE
INTRAMUSCULAR | Status: DC | PRN
  Administered 2020-10-27: 100 mg via INTRAVENOUS

## 2020-10-27 SURGICAL SUPPLY — 67 items
ADH SKN CLS APL DERMABOND .7 (GAUZE/BANDAGES/DRESSINGS) ×2
APL PRP STRL LF DISP 70% ISPRP (MISCELLANEOUS) ×2
APL SWBSTK 6 STRL LF DISP (MISCELLANEOUS) ×2
APPLICATOR COTTON TIP 6 STRL (MISCELLANEOUS) ×2 IMPLANT
APPLICATOR COTTON TIP 6IN STRL (MISCELLANEOUS) ×3
BAG COUNTER SPONGE SURGICOUNT (BAG) IMPLANT
BAG SPNG CNTER NS LX DISP (BAG)
CATH FOLEY 2WAY SLVR 18FR 30CC (CATHETERS) ×3 IMPLANT
CATH ROBINSON RED A/P 16FR (CATHETERS) ×3 IMPLANT
CATH ROBINSON RED A/P 8FR (CATHETERS) ×3 IMPLANT
CATH TIEMANN FOLEY 18FR 5CC (CATHETERS) ×3 IMPLANT
CHLORAPREP W/TINT 26 (MISCELLANEOUS) ×3 IMPLANT
CLIP VESOLOCK LG 6/CT PURPLE (CLIP) ×12 IMPLANT
COVER SURGICAL LIGHT HANDLE (MISCELLANEOUS) ×3 IMPLANT
COVER TIP SHEARS 8 DVNC (MISCELLANEOUS) ×2 IMPLANT
COVER TIP SHEARS 8MM DA VINCI (MISCELLANEOUS) ×3
CUTTER ECHEON FLEX ENDO 45 340 (ENDOMECHANICALS) ×3 IMPLANT
DECANTER SPIKE VIAL GLASS SM (MISCELLANEOUS) ×3 IMPLANT
DERMABOND ADVANCED (GAUZE/BANDAGES/DRESSINGS) ×1
DERMABOND ADVANCED .7 DNX12 (GAUZE/BANDAGES/DRESSINGS) ×2 IMPLANT
DRAIN CHANNEL RND F F (WOUND CARE) IMPLANT
DRAPE ARM DVNC X/XI (DISPOSABLE) ×8 IMPLANT
DRAPE COLUMN DVNC XI (DISPOSABLE) ×2 IMPLANT
DRAPE DA VINCI XI ARM (DISPOSABLE) ×12
DRAPE DA VINCI XI COLUMN (DISPOSABLE) ×3
DRAPE SURG IRRIG POUCH 19X23 (DRAPES) ×3 IMPLANT
DRSG TEGADERM 4X4.75 (GAUZE/BANDAGES/DRESSINGS) ×3 IMPLANT
ELECT PENCIL ROCKER SW 15FT (MISCELLANEOUS) ×3 IMPLANT
ELECT REM PT RETURN 15FT ADLT (MISCELLANEOUS) ×3 IMPLANT
GAUZE SPONGE 2X2 8PLY STRL LF (GAUZE/BANDAGES/DRESSINGS) IMPLANT
GLOVE SURG ENC MOIS LTX SZ6.5 (GLOVE) ×3 IMPLANT
GLOVE SURG ENC TEXT LTX SZ7.5 (GLOVE) ×6 IMPLANT
GOWN STRL REUS W/TWL LRG LVL3 (GOWN DISPOSABLE) ×9 IMPLANT
HOLDER FOLEY CATH W/STRAP (MISCELLANEOUS) ×3 IMPLANT
IRRIG SUCT STRYKERFLOW 2 WTIP (MISCELLANEOUS) ×3
IRRIGATION SUCT STRKRFLW 2 WTP (MISCELLANEOUS) ×2 IMPLANT
IV LACTATED RINGERS 1000ML (IV SOLUTION) ×3 IMPLANT
KIT TURNOVER KIT A (KITS) ×3 IMPLANT
NDL SAFETY ECLIPSE 18X1.5 (NEEDLE) ×2 IMPLANT
NEEDLE HYPO 18GX1.5 SHARP (NEEDLE) ×3
PACK ROBOT UROLOGY CUSTOM (CUSTOM PROCEDURE TRAY) ×3 IMPLANT
PENCIL SMOKE EVACUATOR (MISCELLANEOUS) IMPLANT
RELOAD STAPLE 45 4.1 GRN THCK (STAPLE) ×2 IMPLANT
SEAL CANN UNIV 5-8 DVNC XI (MISCELLANEOUS) ×8 IMPLANT
SEAL XI 5MM-8MM UNIVERSAL (MISCELLANEOUS) ×12
SET TUBE SMOKE EVAC HIGH FLOW (TUBING) ×3 IMPLANT
SOLUTION ELECTROLUBE (MISCELLANEOUS) ×3 IMPLANT
SPONGE GAUZE 2X2 STER 10/PKG (GAUZE/BANDAGES/DRESSINGS) ×1
STAPLE RELOAD 45 GRN (STAPLE) ×2 IMPLANT
STAPLE RELOAD 45MM GREEN (STAPLE) ×3
SUT ETHILON 3 0 PS 1 (SUTURE) ×3 IMPLANT
SUT MNCRL 3 0 RB1 (SUTURE) ×2 IMPLANT
SUT MNCRL 3 0 VIOLET RB1 (SUTURE) ×2 IMPLANT
SUT MNCRL AB 4-0 PS2 18 (SUTURE) ×6 IMPLANT
SUT MONOCRYL 3 0 RB1 (SUTURE) ×6
SUT VIC AB 0 CT1 27 (SUTURE) ×3
SUT VIC AB 0 CT1 27XBRD ANTBC (SUTURE) ×2 IMPLANT
SUT VIC AB 0 UR5 27 (SUTURE) ×3 IMPLANT
SUT VIC AB 2-0 SH 27 (SUTURE) ×3
SUT VIC AB 2-0 SH 27X BRD (SUTURE) ×2 IMPLANT
SUT VIC AB 3-0 SH 27 (SUTURE) ×3
SUT VIC AB 3-0 SH 27XBRD (SUTURE) IMPLANT
SUT VICRYL 0 UR6 27IN ABS (SUTURE) ×6 IMPLANT
SYR 27GX1/2 1ML LL SAFETY (SYRINGE) ×3 IMPLANT
TOWEL OR NON WOVEN STRL DISP B (DISPOSABLE) ×3 IMPLANT
TROCAR XCEL NON-BLD 5MMX100MML (ENDOMECHANICALS) IMPLANT
WATER STERILE IRR 1000ML POUR (IV SOLUTION) ×3 IMPLANT

## 2020-10-27 NOTE — Plan of Care (Signed)
  Problem: Education: Goal: Knowledge of the procedure and recovery process will improve Outcome: Progressing   Problem: Pain Management: Goal: General experience of comfort will improve Outcome: Progressing   Problem: Urinary Elimination: Goal: Ability to avoid or minimize complications of infection will improve Outcome: Progressing Goal: Ability to achieve and maintain urine output will improve Outcome: Progressing

## 2020-10-27 NOTE — Anesthesia Procedure Notes (Signed)
Procedure Name: Intubation Date/Time: 10/27/2020 10:41 AM Performed by: Claudia Desanctis, CRNA Pre-anesthesia Checklist: Patient identified, Emergency Drugs available, Suction available and Patient being monitored Patient Re-evaluated:Patient Re-evaluated prior to induction Oxygen Delivery Method: Circle system utilized Preoxygenation: Pre-oxygenation with 100% oxygen Induction Type: IV induction Ventilation: Mask ventilation without difficulty Laryngoscope Size: 2 and Miller Grade View: Grade I Tube type: Oral Tube size: 7.5 mm Number of attempts: 1 Airway Equipment and Method: Stylet Placement Confirmation: ETT inserted through vocal cords under direct vision, positive ETCO2 and breath sounds checked- equal and bilateral Tube secured with: Tape Dental Injury: Teeth and Oropharynx as per pre-operative assessment

## 2020-10-27 NOTE — Interval H&P Note (Signed)
History and Physical Interval Note:  10/27/2020 9:43 AM  [name]  has presented today for surgery, with the diagnosis of PROSTATE CANCER.  The various methods of treatment have been discussed with the patient and family. After consideration of risks, benefits and other options for treatment, the patient has consented to  Procedure(s): XI ROBOTIC ASSISTED LAPAROSCOPIC RADICAL PROSTATECTOMY LEVEL 2 (N/A) LYMPHADENECTOMY, PELVIC (Bilateral) as a surgical intervention.  The patient's history has been reviewed, patient examined, no change in status, stable for surgery.  He was noted to be in atrial fibrillation of new onset last week preoperatively.  He was evaluated by cardiology earlier this week and underwent an echocardiogram that was unremarkable for concerning findings.  As such, he has been cleared to proceed with surgery with low risk and will plan to begin anticoagulation postoperatively when appropriate. I have reviewed the patient's chart and labs.  Questions were answered to the patient's satisfaction.     [employer]

## 2020-10-27 NOTE — Anesthesia Postprocedure Evaluation (Signed)
Anesthesia Post Note  Patient: Geremy Rister  Procedure(s) Performed: XI ROBOTIC ASSISTED LAPAROSCOPIC RADICAL PROSTATECTOMY LEVEL 2 LYMPHADENECTOMY, PELVIC (Bilateral)     Patient location during evaluation: PACU Anesthesia Type: General Level of consciousness: awake and alert Pain management: pain level controlled Vital Signs Assessment: post-procedure vital signs reviewed and stable Respiratory status: spontaneous breathing, nonlabored ventilation, respiratory function stable and patient connected to nasal cannula oxygen Cardiovascular status: blood pressure returned to baseline and stable Postop Assessment: no apparent nausea or vomiting Anesthetic complications: no   No notable events documented.  Last Vitals:  Vitals:   10/27/20 1545 10/27/20 1621  BP: (!) 142/94 (!) 160/105  Pulse: 65 77  Resp: 20 18  Temp: 36.6 C 36.5 C  SpO2: 99% 95%    Last Pain:  Vitals:   10/27/20 1621  TempSrc: Oral  PainSc: 8                  Catalina Gravel

## 2020-10-27 NOTE — Transfer of Care (Signed)
Immediate Anesthesia Transfer of Care Note  Patient: [name]  Procedure(s) Performed: XI ROBOTIC ASSISTED LAPAROSCOPIC RADICAL PROSTATECTOMY LEVEL 2 LYMPHADENECTOMY, PELVIC (Bilateral)  Patient Location: PACU  Anesthesia Type:General  Level of Consciousness: awake and patient cooperative  Airway & Oxygen Therapy: Patient Spontanous Breathing and Patient connected to face mask  Post-op Assessment: Report given to RN and Post -op Vital signs reviewed and stable  Post vital signs: Reviewed and stable  Last Vitals:  Vitals Value Taken Time  BP 131/93 10/27/20 1432  Temp    Pulse 60 10/27/20 1435  Resp 22 10/27/20 1435  SpO2 100 % 10/27/20 1435  Vitals shown include unvalidated device data.  Last Pain:  Vitals:   10/27/20 0952  TempSrc:   PainSc: 0-No pain         Complications: No notable events documented.

## 2020-10-27 NOTE — Op Note (Signed)
Preoperative diagnosis: Clinically localized adenocarcinoma of the prostate (clinical stage T1c Nx Mx)  Postoperative diagnosis: Clinically localized adenocarcinoma of the prostate (clinical stage T1c Nx Mx)  Procedure:  Robotic assisted laparoscopic radical prostatectomy (bilateral nerve sparing) Bilateral robotic assisted laparoscopic pelvic lymphadenectomy  Surgeon: Pryor Curia. M.D.  Assistant: Debbrah Alar, PA-C  An assistant was required for this surgical procedure.  The duties of the assistant included but were not limited to suctioning, passing suture, camera manipulation, retraction. This procedure would not be able to be performed without an Environmental consultant.  Resident: Dr. Bishop Limbo  Anesthesia: General  Complications: None  EBL: 100 mL  IVF:  2600 mL crystalloid, 500 mL colloid  Specimens: Prostate and seminal vesicles Right pelvic lymph nodes Left pelvic lymph nodes  Disposition of specimens: Pathology  Drains: 20 Fr coude catheter # 19 Blake pelvic drain  Indication: [name] is a 60 y.o. year old patient with clinically localized prostate cancer.  After a thorough review of the management options for treatment of prostate cancer, he elected to proceed with surgical therapy and the above procedure(s).  We have discussed the potential benefits and risks of the procedure, side effects of the proposed treatment, the likelihood of the patient achieving the goals of the procedure, and any potential problems that might occur during the procedure or recuperation. Informed consent has been obtained.  Description of procedure:  The patient was taken to the operating room and a general anesthetic was administered. He was given preoperative antibiotics, placed in the dorsal lithotomy position, and prepped and draped in the usual sterile fashion. Next a preoperative timeout was performed. A urethral catheter was placed into the bladder and a site was selected  near the umbilicus for placement of the camera port. This was placed using a standard open Hassan technique which allowed entry into the peritoneal cavity under direct vision and without difficulty. An 8 mm robotic port was placed and a pneumoperitoneum established. The camera was then used to inspect the abdomen and there was no evidence of any intra-abdominal injuries or other abnormalities. The remaining abdominal ports were then placed. 8 mm robotic ports were placed in the right lower quadrant, left lower quadrant, and far left lateral abdominal wall. A 5 mm port was placed in the right upper quadrant and a 12 mm port was placed in the right lateral abdominal wall for laparoscopic assistance. All ports were placed under direct vision without difficulty. The surgical cart was then docked.   Utilizing the cautery scissors, the bladder was reflected posteriorly allowing entry into the space of Retzius and identification of the endopelvic fascia and prostate. The periprostatic fat was then removed from the prostate allowing full exposure of the endopelvic fascia. The endopelvic fascia was then incised from the apex back to the base of the prostate bilaterally and the underlying levator muscle fibers were swept laterally off the prostate thereby isolating the dorsal venous complex. The dorsal vein was then stapled and divided with a 45 mm Flex Echelon stapler. Attention then turned to the bladder neck which was divided anteriorly thereby allowing entry into the bladder and exposure of the urethral catheter. The catheter balloon was deflated and the catheter was brought into the operative field and used to retract the prostate anteriorly. The posterior bladder neck was then examined and was divided allowing further dissection between the bladder and prostate posteriorly until the vasa deferentia and seminal vessels were identified. The vasa deferentia were isolated, divided, and lifted anteriorly.  The seminal  vesicles were dissected down to their tips with care to control the seminal vascular arterial blood supply. These structures were then lifted anteriorly and the space between Denonvillier's fascia and the anterior rectum was developed with a combination of sharp and blunt dissection. This isolated the vascular pedicles of the prostate.  The lateral prostatic fascia was then sharply incised allowing release of the neurovascular bundles bilaterally. The vascular pedicles of the prostate were then ligated with Weck clips between the prostate and neurovascular bundles and divided with sharp cold scissor dissection resulting in neurovascular bundle preservation. The neurovascular bundles were then separated off the apex of the prostate and urethra bilaterally.  The urethra was then sharply transected allowing the prostate specimen to be disarticulated. The pelvis was copiously irrigated and hemostasis was ensured. There was no evidence for rectal injury.  Attention then turned to the right pelvic sidewall. The fibrofatty tissue between the external iliac vein, confluence of the iliac vessels, hypogastric artery, and Cooper's ligament was dissected free from the pelvic sidewall with care to preserve the obturator nerve. Weck clips were used for lymphostasis and hemostasis. An identical procedure was performed on the contralateral side and the lymphatic packets were removed for permanent pathologic analysis.  Attention then turned to the urethral anastomosis. A 2-0 Vicryl slip knot was placed between Denonvillier's fascia, the posterior bladder neck, and the posterior urethra to reapproximate these structures. A double-armed 3-0 Monocryl suture was then used to perform a 360 running tension-free anastomosis between the bladder neck and urethra. A new urethral catheter was then placed into the bladder and irrigated. There were no blood clots within the bladder and the anastomosis appeared to be watertight. A #19  Blake drain was then brought through the left lateral 8 mm port site and positioned appropriately within the pelvis. It was secured to the skin with a nylon suture. The surgical cart was then undocked. The right lateral 12 mm port site was closed at the fascial level with a 0 Vicryl suture placed laparoscopically. All remaining ports were then removed under direct vision. The prostate specimen was removed intact within the Endopouch retrieval bag via the periumbilical camera port site. This fascial opening was closed with two running 0 Vicryl sutures. 0.25% Marcaine was then injected into all port sites and all incisions were reapproximated at the skin level with 4-0 Monocryl subcuticular sutures and Dermabond. The patient appeared to tolerate the procedure well and without complications. The patient was able to be extubated and transferred to the recovery unit in satisfactory condition.   Pryor Curia MD

## 2020-10-27 NOTE — Discharge Instructions (Addendum)
Activity:  You are encouraged to ambulate frequently (about every hour during waking hours) to help prevent blood clots from forming in your legs or lungs.  However, you should not engage in any heavy lifting (> 10-15 lbs), strenuous activity, or straining. Diet: You should continue a clear liquid diet until passing gas from below.  Once this occurs, you may advance your diet to a soft diet that would be easy to digest (i.e soups, scrambled eggs, mashed potatoes, etc.) for 24 hours just as you would if getting over a bad stomach flu.  If tolerating this diet well for 24 hours, you may then begin eating regular food.  It will be normal to have some amount of bloating, nausea, and abdominal discomfort intermittently. Prescriptions:  You will be provided a prescription for pain medication to take as needed.  If your pain is not severe enough to require the prescription pain medication, you may take Tylenol instead.  You should also take an over the counter stool softener (Colace 100 mg twice daily) to avoid straining with bowel movements as the pain medication may constipate you. Finally, you will also be provided a prescription for an antibiotic to begin the day prior to your return visit in the office for catheter removal. Catheter care: You will be taught how to take care of the catheter by the nursing staff prior to discharge from the hospital.  You may use both a leg bag and the larger bedside bag but it is recommended to at least use the bigger bedside bag at nighttime as the leg bag is small and will fill up overnight and also does not drain as well when lying flat. You may periodically feel a strong urge to void with the catheter in place.  This is a bladder spasm and most often can occur when having a bowel movement or when you are moving around. It is typically self-limited and usually will stop after a few minutes.  You may use some Vaseline or Neosporin around the tip of the catheter to reduce friction  at the tip of the penis. Incisions: You may remove your dressing bandages the 2nd day after surgery.  You most likely will have a few small staples in each of the incisions and once the bandages are removed, the incisions may stay open to air.  You may start showering (not soaking or bathing in water) 48 hours after surgery and the incisions simply need to be patted dry after the shower.  No additional care is needed. What to call us about: You should call the office [phone]) if you develop fever > 101, persistent vomiting, or the catheter stops draining. Also, feel free to call with any other questions you may have and remember the handout that was provided to you as a reference preoperatively which answers many of the common questions that arise after surgery. You may resume aspirin, advil, aleve, vitamins, and supplements 7 days after surgery.   You may start Eliquis on Monday 10/31/20.

## 2020-10-28 ENCOUNTER — Encounter (HOSPITAL_COMMUNITY): Payer: Self-pay | Admitting: Urology

## 2020-10-28 ENCOUNTER — Other Ambulatory Visit: Payer: Self-pay

## 2020-10-28 DIAGNOSIS — C61 Malignant neoplasm of prostate: Secondary | ICD-10-CM | POA: Diagnosis not present

## 2020-10-28 LAB — GLUCOSE, CAPILLARY
Glucose-Capillary: 111 mg/dL — ABNORMAL HIGH (ref 70–99)
Glucose-Capillary: 128 mg/dL — ABNORMAL HIGH (ref 70–99)
Glucose-Capillary: 133 mg/dL — ABNORMAL HIGH (ref 70–99)
Glucose-Capillary: 135 mg/dL — ABNORMAL HIGH (ref 70–99)
Glucose-Capillary: 141 mg/dL — ABNORMAL HIGH (ref 70–99)
Glucose-Capillary: 99 mg/dL (ref 70–99)

## 2020-10-28 LAB — HEMOGLOBIN AND HEMATOCRIT, BLOOD
HCT: 42.8 % (ref 39.0–52.0)
Hemoglobin: 14 g/dL (ref 13.0–17.0)

## 2020-10-28 MED ORDER — METOCLOPRAMIDE HCL 5 MG/ML IJ SOLN
5.0000 mg | Freq: Four times a day (QID) | INTRAMUSCULAR | Status: DC
  Administered 2020-10-28 – 2020-10-29 (×4): 5 mg via INTRAVENOUS
  Filled 2020-10-28 (×4): qty 2

## 2020-10-28 MED ORDER — TRAMADOL HCL 50 MG PO TABS
50.0000 mg | ORAL_TABLET | Freq: Four times a day (QID) | ORAL | Status: DC | PRN
Start: 2020-10-28 — End: 2020-10-29
  Administered 2020-10-28: 100 mg via ORAL
  Filled 2020-10-28 (×2): qty 2

## 2020-10-28 MED ORDER — BISACODYL 10 MG RE SUPP
10.0000 mg | Freq: Once | RECTAL | Status: AC
  Administered 2020-10-28: 10 mg via RECTAL
  Filled 2020-10-28: qty 1

## 2020-10-28 NOTE — Progress Notes (Signed)
Urology Progress Note   1 Day Post-Op from robotic prostatectomy on 10/27/2020 with Dr. Alinda Money.   Subjective: Vital signs are stable.  CV urine output during overnight shift.  60 cc of JP output during overnight shift.  Serosanguineous.  Globin stable at 14 this morning.  Patient has been out of bed and ambulating.  Passing gas.  Tolerating clears. .Did require a dose of morphine overnight for his abdominal pain  Objective: Vital signs in last 24 hours: Temp:  [97.7 F (36.5 C)-98.8 F (37.1 C)] 98.2 F (36.8 C) (07/22 0346) Pulse Rate:  [54-77] 63 (07/22 0346) Resp:  [10-23] 16 (07/22 0346) BP: (119-160)/(83-107) 119/83 (07/22 0346) SpO2:  [94 %-100 %] 97 % (07/22 0346) Weight:  [134.4 kg] 134.4 kg (07/21 0953)  Intake/Output from previous day: 07/21 0701 - 07/22 0700 In: 6489.8 [P.O.:720; I.V.:4069.8; IV Piggyback:1700] Out: 965 [Urine:725; Drains:140; Blood:100] Intake/Output this shift: No intake/output data recorded.  Physical Exam:  General: Alert and oriented CV: Regular rate Lungs: No increased work of breathing Abdomen:  Soft, appropriately tender. Incisions c/d/i. JP SS GU: Foley in place draining clear pink urine  Ext: NT, No erythema  Lab Results: Recent Labs    10/27/20 1454 10/28/20 0433  HGB 15.3 14.0  HCT 47.9 42.8   No results for input(s): NA, K, CL, CO2, GLUCOSE, BUN, CREATININE, CALCIUM in the last 72 hours.  Studies/Results: No results found.  Assessment/Plan:  60 y.o. male s/p Robotic prostatectomy. Overall doing well post-op.   Plan for normalizing today for discontinuing IV fluids and IV pain medications.  Continue to ambulate.  Continue Foley catheter.    Dispo: Likely home later today as long as he is doing well in the early afternoon.   LOS: 0 days

## 2020-10-28 NOTE — Progress Notes (Signed)
Patient ID: [name], male   DOB: 05-11-1960, 60 y.o.   MRN: [id number]  1 Day Post-Op Subjective: Doing better.  Passed some flatus.  Abdominal pain improved.  Last took pain medication around 2 AM.  No nausea or vomiting.  Ambulated well.  Objective: Vital signs in last 24 hours: Temp:  [97.7 F (36.5 C)-98.8 F (37.1 C)] 98.2 F (36.8 C) (07/22 0346) Pulse Rate:  [54-77] 63 (07/22 0346) Resp:  [10-23] 16 (07/22 0346) BP: (119-160)/(83-107) 119/83 (07/22 0346) SpO2:  [94 %-100 %] 97 % (07/22 0346) Weight:  [134.4 kg] 134.4 kg (07/21 0953)  Intake/Output from previous day: 07/21 0701 - 07/22 0700 In: 6489.8 [P.O.:720; I.V.:4069.8; IV Piggyback:1700] Out: 965 [Urine:725; Drains:140; Blood:100] Intake/Output this shift: No intake/output data recorded.  Physical Exam:  General: Alert and oriented. CV: RRR Lungs: Clear bilaterally. GI: Soft, Nondistended. Positive BS. Incisions: Clean, dry, and intact Urine: Clear Extremities: Nontender, no erythema, no edema.  Lab Results: Recent Labs    10/27/20 1454 10/28/20 0433  HGB 15.3 14.0  HCT 47.9 42.8      Assessment/Plan: POD# 1 s/p robotic prostatectomy.  1) SL IVF 2) Ambulate, Incentive spirometry 3) Transition to oral pain medication 4) Dulcolax suppository 5) D/C pelvic drain 6) Plan for likely discharge later today   [name]. MD   LOS: 0 days   Dutch Gray 10/28/2020, 7:29 AM

## 2020-10-28 NOTE — Plan of Care (Signed)
  Problem: Bowel/Gastric: Goal: Gastrointestinal status for postoperative course will improve Outcome: Progressing   Problem: Pain Management: Goal: General experience of comfort will improve Outcome: Progressing   Problem: Activity: Goal: Risk for activity intolerance will decrease Outcome: Progressing

## 2020-10-28 NOTE — Plan of Care (Signed)
  Problem: Education: Goal: Knowledge of the procedure and recovery process will improve Outcome: Progressing   Problem: Bowel/Gastric: Goal: Gastrointestinal status for postoperative course will improve Outcome: Progressing   Problem: Pain Management: Goal: General experience of comfort will improve Outcome: Progressing   Problem: Education: Goal: Knowledge of General Education information will improve Description: Including pain rating scale, medication(s)/side effects and non-pharmacologic comfort measures Outcome: Progressing   Problem: Activity: Goal: Risk for activity intolerance will decrease Outcome: Progressing

## 2020-10-28 NOTE — Progress Notes (Signed)
Patient ID: [name], male   DOB: 1961/03/28, 60 y.o.   MRN: [id number]  1 Day Post-Op Subjective: Pt with continued abdominal distention and pain today.  Tolerating only small amount of po liquids.  He did have a small amount of flatus after a suppository with some relief but still with significant pain/distention now.  Objective: Vital signs in last 24 hours: Temp:  [97.9 F (36.6 C)-98.8 F (37.1 C)] 98.5 F (36.9 C) (07/22 1727) Pulse Rate:  [53-63] 53 (07/22 1727) Resp:  [16-24] 16 (07/22 1727) BP: (119-154)/(83-107) 137/90 (07/22 1727) SpO2:  [96 %-100 %] 100 % (07/22 1727)  Intake/Output from previous day: 07/21 0701 - 07/22 0700 In: 6489.8 [P.O.:720; I.V.:4069.8; IV Piggyback:1700] Out: 965 [Urine:725; Drains:140; Blood:100] Intake/Output this shift: Total I/O In: -  Out: 440 [Urine:350; Drains:90]  Physical Exam:  General: Alert and oriented Abdomen: Active bowel sounds, moderate distention Incisions: C/D/I Ext: NT, No erythema  Lab Results: Recent Labs    10/27/20 1454 10/28/20 0433  HGB 15.3 14.0  HCT 47.9 42.8    Studies/Results: No results found.  Assessment/Plan: Likely has a mild postoperative ileus.  Will have him a continue to ambulate, reglan, and dulcolax suppository tonight.  Once passing flatus and feeling better, he will be ok for discharge home.  Likely in AM.   LOS: 0 days   Dutch Gray 10/28/2020, 5:32 PM

## 2020-10-28 NOTE — Discharge Summary (Signed)
Date of admission: 10/27/2020  Date of discharge: 10/29/2020  Admission diagnosis: Prostate Cancer  Discharge diagnosis: Prostate Cancer  History and Physical: For full details, please see admission history and physical. Briefly, [name] is a 60 y.o. gentleman with localized prostate cancer.  After discussing management/treatment options, he elected to proceed with surgical treatment.  Hospital Course: [name] was taken to the operating room on 10/27/2020 and underwent a robotic assisted laparoscopic radical prostatectomy. He tolerated this procedure well and without complications. Postoperatively, he was able to be transferred to a regular hospital room following recovery from anesthesia.  He was able to begin ambulating the night of surgery. He remained hemodynamically stable overnight.  He had excellent urine output with appropriately minimal output from his pelvic drain and his pelvic drain was removed on POD #1.  He had a slight postoperative ileus.  He was given Dulcolax suppository and Reglan.  He began passing gas on the evening of postop day 1.  He was transitioned to oral pain medication, tolerated a clear liquid diet, and had met all discharge criteria and was able to be discharged home later on POD#2.  Laboratory values:  Recent Labs    10/27/20 1454 10/28/20 0433  HGB 15.3 14.0  HCT 47.9 42.8    Disposition: Home  Discharge instruction: He was instructed to be ambulatory but to refrain from heavy lifting, strenuous activity, or driving. He was instructed on urethral catheter care.  Discharge medications:   Allergies as of 10/29/2020       Reactions   Sulfonamide Derivatives Rash   Tongue and mouth broke out        Medication List     STOP taking these medications    alfuzosin 10 MG 24 hr tablet Commonly known as: UROXATRAL   apixaban 5 MG Tabs tablet Commonly known as: ELIQUIS   Cholecalciferol 25 MCG (1000 UT) tablet   Fish Oil Concentrate 1000 MG  Caps   ibuprofen 200 MG tablet Commonly known as: ADVIL   naproxen sodium 220 MG tablet Commonly known as: ALEVE       TAKE these medications    accu-chek multiclix lancets Use as instructed. DX: E11.65   allopurinol 100 MG tablet Commonly known as: ZYLOPRIM Take 1 tablet (100 mg total) by mouth 2 (two) times daily. Annual appt due in May must see provider for future refills What changed: additional instructions   atorvastatin 40 MG tablet Commonly known as: LIPITOR Take 1 tablet (40 mg total) by mouth daily. Annual appt due in May must see provider for future refills What changed:  when to take this additional instructions   cetirizine 10 MG tablet Commonly known as: ZYRTEC Take 10 mg by mouth daily.   ciprofloxacin 500 MG tablet Commonly known as: Cipro Take 1 tablet (500 mg total) by mouth 2 (two) times daily. Start day prior to office visit for foley removal   docusate sodium 100 MG capsule Commonly known as: COLACE Take 1 capsule (100 mg total) by mouth 2 (two) times daily.   empagliflozin 25 MG Tabs tablet Commonly known as: JARDIANCE Take 12.5 mg by mouth daily.   ferrous sulfate 325 (65 FE) MG tablet Take 325 mg by mouth daily with breakfast. NOT TAKING   glucose blood test strip Use as instructed. DX E11.65   lisinopril 40 MG tablet Commonly known as: ZESTRIL Take 40 mg by mouth every evening.   metFORMIN 500 MG tablet Commonly known as: GLUCOPHAGE Take 1,000 mg by mouth  2 (two) times daily with a meal.   metoprolol succinate 50 MG 24 hr tablet Commonly known as: TOPROL-XL Take 25 mg by mouth every evening. Take with or immediately following a meal total 44m   traMADol 50 MG tablet Commonly known as: Ultram Take 1-2 tablets (50-100 mg total) by mouth every 6 (six) hours as needed for moderate pain or severe pain.        Followup: He will followup in 1 week for catheter removal and to discuss his surgical pathology results.

## 2020-10-29 DIAGNOSIS — C61 Malignant neoplasm of prostate: Secondary | ICD-10-CM | POA: Diagnosis not present

## 2020-10-29 LAB — GLUCOSE, CAPILLARY
Glucose-Capillary: 98 mg/dL (ref 70–99)
Glucose-Capillary: 123 mg/dL — ABNORMAL HIGH (ref 70–99)
Glucose-Capillary: 99 mg/dL (ref 70–99)

## 2020-11-03 ENCOUNTER — Ambulatory Visit: Admitting: Radiation Oncology

## 2020-11-03 ENCOUNTER — Ambulatory Visit: Payer: Self-pay | Admitting: Urology

## 2020-11-03 LAB — SURGICAL PATHOLOGY

## 2020-11-04 ENCOUNTER — Ambulatory Visit: Admitting: Cardiology

## 2021-08-04 ENCOUNTER — Emergency Department: Admission: EM | Admit: 2021-08-04 | Discharge: 2021-08-04 | Discharge status: Absent without leave | Payer: Self-pay
